# Patient Record
Sex: Female | Born: 1975 | Race: Black or African American | Hispanic: No | Marital: Single | Smoking: Never smoker
Health system: Southern US, Community
[De-identification: ages and names within clinical notes are randomized; demographics above are authoritative.]

## PROBLEM LIST (undated history)

## (undated) ENCOUNTER — Inpatient Hospital Stay (HOSPITAL_COMMUNITY): Payer: Self-pay

## (undated) DIAGNOSIS — N912 Amenorrhea, unspecified: Secondary | ICD-10-CM

## (undated) DIAGNOSIS — N83209 Unspecified ovarian cyst, unspecified side: Secondary | ICD-10-CM

## (undated) DIAGNOSIS — D573 Sickle-cell trait: Secondary | ICD-10-CM

## (undated) DIAGNOSIS — G43909 Migraine, unspecified, not intractable, without status migrainosus: Secondary | ICD-10-CM

## (undated) DIAGNOSIS — B379 Candidiasis, unspecified: Secondary | ICD-10-CM

## (undated) DIAGNOSIS — Z8619 Personal history of other infectious and parasitic diseases: Secondary | ICD-10-CM

## (undated) DIAGNOSIS — Z98891 History of uterine scar from previous surgery: Secondary | ICD-10-CM

## (undated) DIAGNOSIS — J45909 Unspecified asthma, uncomplicated: Secondary | ICD-10-CM

## (undated) HISTORY — DX: Migraine, unspecified, not intractable, without status migrainosus: G43.909

## (undated) HISTORY — DX: Amenorrhea, unspecified: N91.2

## (undated) HISTORY — DX: Sickle-cell trait: D57.3

## (undated) HISTORY — DX: Candidiasis, unspecified: B37.9

## (undated) HISTORY — PX: WISDOM TOOTH EXTRACTION: SHX21

## (undated) HISTORY — DX: Unspecified ovarian cyst, unspecified side: N83.209

## (undated) HISTORY — DX: Personal history of other infectious and parasitic diseases: Z86.19

---

## 2007-11-02 ENCOUNTER — Emergency Department (HOSPITAL_COMMUNITY): Admission: EM | Admit: 2007-11-02 | Discharge: 2007-11-02 | Payer: Self-pay | Admitting: Emergency Medicine

## 2007-11-02 ENCOUNTER — Encounter (INDEPENDENT_AMBULATORY_CARE_PROVIDER_SITE_OTHER): Payer: Self-pay | Admitting: Interventional Cardiology

## 2010-09-15 NOTE — Consult Note (Signed)
NAMEAERIELLE, STOKLOSA NO.:  [id number]   MEDICAL RECORD NO.:  [id number]          PATIENT TYPE:  EMS   LOCATION:  ED                           FACILITY:  [facility]   PHYSICIAN:  Lyn Records, M.D.   DATE OF BIRTH:  09-20-1975   DATE OF CONSULTATION:  DATE OF DISCHARGE:  11/02/2007                                 CONSULTATION   CONCLUSIONS:  1. Abnormal EKG with inferolateral T-wave abnormality of uncertain      significance.  EKG was done when the patient was having pain and      some component of hyperventilation and it was felt that the EKG      changes may simply represent hyperventilatory EKGs, T-wave shifts.      a.     Normal 2-D Doppler echocardiogram.      b.     Negative cardiac markers for evidence of cardiac injury.  2. Generalized myalgias including chest wall soreness and pain.      a.     Elevated temperature.      b.     Chills.   RECOMMENDATIONS:  1. Anti-inflammatory therapy to treat suspected viral syndrome.  2. Provide Dr. Samuel Bouche with a copy of the EKG done today and I will      recommend that she have a repeat EKG once this viral syndrome has      resolved.  3. If recurrent chest pain, we will require further cardiac workup to      exclude ischemic heart disease.  4. No specific other cardiac measures are felt to be indicated at this      time.   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  She denies any prior history of  heart disease, she is not diabetic, she does not smoke, and is not  hypertensive.  This morning, she awakened with soreness and discomfort  in her chest.  This quickly spread to the large muscle groups throughout  the rest of her body including her arms and legs.  She began having some  chills and feels that she developed a low-grade temperature.  She came  to the emergency room, when she was in significant discomfort with  aching throughout her body and also  felt that she was quite nervous and  would have been hyperventilated somewhat.  The chest discomfort that she  initially experienced has significantly resolved.  Because she had chest  discomfort, an EKG was done in the emergency room that revealed heart  rate of 98 beats per minute with inverted T waves, II, III, and aVF, V4  through V6.  She is currently not having any pleuritic component to  chest discomfort and the generalized muscle aches and pains have also  subsided.   MEDICATIONS:  None.   ALLERGIES:  None.   SIGNIFICANT MEDICAL PROBLEMS:  None.   HABITS:  She does not smoke.   PHYSICAL EXAMINATION:  GENERAL:  On exam, the patient is in no acute  distress.  VITAL SIGNS:  Blood pressures 120/72.  Heart rate is around 100.  She  denies dyspnea.  Respirations 16.  SKIN:  Clear.  No nail bed cyanosis is noted.  HEENT:  Pupils are equal and reactive.  No obvious jaundice is noted.  NECK:  No carotid bruits are heard.  CARDIAC:  Exam is unremarkable.  No rub is heard.  No murmur is heard.  LUNGS:  Clear.  EXTREMITIES:  Reveal no edema.  Pulses are equal and symmetric in the  feet and in the upper extremities.   LABORATORY DATA:  EKG demonstrates prominent voltage with inferolateral  biphasic to inverted T-waves.  No acute ST-T wave changes noted.   Laboratory data reveals a hemoglobin of 13.6 and WBC of 11.3.  Potassium  of 3.7 and creatinine of 0.81.  Liver tests are normal.  Point-of-care  cardiac markers are negative x2.  D-dimer is normal at 0.36.  Chest x-  ray reveals no acute process.  Cardiac size is normal.  There is no  evidence of congestion.   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  I  believe she is in the midst of a viral syndrome.  EKG does demonstrate  inferolateral T-wave inversions.  Explanation for this is obscured at  this point.  Possibilities include T-wave inversion secondary to  hyperventilation related to her viral  syndrome and anxiety.  Other  option/possibility includes chronic T-wave abnormality that is simply  picked up at this time, but not related to her current clinical process.  If these are chronic changes, they could reflect the presence of  hypertension.  We did do a stat echo today, which demonstrated no  significant structural abnormalities.  The posterior wall thickness is  at the upper limit of normal.  The EF is 75%.  The valves are all  normal.  There is no pericardial effusion.  I feel confident allowing  her to be discharged home on anti-inflammatory therapy and certainly  should she have any recurrence, if any significant chest discomfort,  further evaluation with an ischemic evaluation should be considered.  This is especially important in light of her mother's history of  myocardial infarction at age 20.      Lyn Records, M.D.  Electronically Signed     HWS/MEDQ  D:  11/02/2007  T:  11/02/2007  Job:  [id number]   cc:   Seleta Rhymes, DO

## 2011-01-28 LAB — URINE CULTURE: Colony Count: 2000

## 2011-01-28 LAB — CBC
HCT: 39.5
MCV: 82.7
Platelets: 300
RDW: 15.6 — ABNORMAL HIGH

## 2011-01-28 LAB — URINALYSIS, ROUTINE W REFLEX MICROSCOPIC
Bilirubin Urine: NEGATIVE
Ketones, ur: NEGATIVE
Nitrite: NEGATIVE
Protein, ur: NEGATIVE
pH: 8.5 — ABNORMAL HIGH

## 2011-01-28 LAB — COMPREHENSIVE METABOLIC PANEL
Albumin: 3.6
BUN: 10
Creatinine, Ser: 0.81
Potassium: 3.7
Total Protein: 6.9

## 2011-01-28 LAB — URINE MICROSCOPIC-ADD ON

## 2011-01-28 LAB — DIFFERENTIAL
Lymphocytes Relative: 8 — ABNORMAL LOW
Monocytes Absolute: 0.6
Monocytes Relative: 6
Neutro Abs: 9.6 — ABNORMAL HIGH

## 2011-01-28 LAB — PROTIME-INR: INR: 0.9

## 2011-01-28 LAB — POCT CARDIAC MARKERS
CKMB, poc: <1 — ABNORMAL LOW
Myoglobin, poc: 41.1

## 2011-01-28 LAB — APTT: aPTT: 25

## 2011-07-15 ENCOUNTER — Ambulatory Visit (INDEPENDENT_AMBULATORY_CARE_PROVIDER_SITE_OTHER): Payer: PRIVATE HEALTH INSURANCE | Admitting: Obstetrics and Gynecology

## 2011-07-15 DIAGNOSIS — N912 Amenorrhea, unspecified: Secondary | ICD-10-CM

## 2011-07-15 DIAGNOSIS — Z202 Contact with and (suspected) exposure to infections with a predominantly sexual mode of transmission: Secondary | ICD-10-CM

## 2011-12-24 DIAGNOSIS — D573 Sickle-cell trait: Secondary | ICD-10-CM | POA: Insufficient documentation

## 2011-12-24 DIAGNOSIS — G43909 Migraine, unspecified, not intractable, without status migrainosus: Secondary | ICD-10-CM | POA: Insufficient documentation

## 2011-12-24 DIAGNOSIS — B379 Candidiasis, unspecified: Secondary | ICD-10-CM

## 2011-12-24 DIAGNOSIS — N912 Amenorrhea, unspecified: Secondary | ICD-10-CM

## 2011-12-24 DIAGNOSIS — N83209 Unspecified ovarian cyst, unspecified side: Secondary | ICD-10-CM

## 2011-12-24 DIAGNOSIS — Z8619 Personal history of other infectious and parasitic diseases: Secondary | ICD-10-CM | POA: Insufficient documentation

## 2011-12-30 ENCOUNTER — Ambulatory Visit (INDEPENDENT_AMBULATORY_CARE_PROVIDER_SITE_OTHER): Payer: PRIVATE HEALTH INSURANCE | Admitting: Obstetrics and Gynecology

## 2011-12-30 ENCOUNTER — Encounter: Payer: Self-pay | Admitting: Obstetrics and Gynecology

## 2011-12-30 VITALS — BP 128/72 | Ht 64.0 in | Wt 200.0 lb

## 2011-12-30 DIAGNOSIS — D573 Sickle-cell trait: Secondary | ICD-10-CM

## 2011-12-30 DIAGNOSIS — Z2089 Contact with and (suspected) exposure to other communicable diseases: Secondary | ICD-10-CM

## 2011-12-30 DIAGNOSIS — Z124 Encounter for screening for malignant neoplasm of cervix: Secondary | ICD-10-CM

## 2011-12-30 DIAGNOSIS — N912 Amenorrhea, unspecified: Secondary | ICD-10-CM

## 2011-12-30 DIAGNOSIS — Z202 Contact with and (suspected) exposure to infections with a predominantly sexual mode of transmission: Secondary | ICD-10-CM

## 2011-12-30 LAB — HCG, QUANTITATIVE, PREGNANCY: hCG, Beta Chain, Quant, S: <2 m[IU]/mL

## 2011-12-30 NOTE — Progress Notes (Signed)
Subjective:  AEX:  Last Pap: 08/04/2010 WNL: Yes.  No history of abnormal Pap Regular Periods:yes Contraception: None Continued in March of 2013 Monthly Breast exam:yes pt c/o feeling lumps in breast since stopping BC in 07/2011 Tetanus<66yrs:yes Nl.Bladder Function:yes Daily BMs:no c/o constipation Healthy Diet:yes Calcium:no Mammogram:no  family history of breast cancer in maternal aunt, great grandmother, and paternal aunt Date of Mammogram: n/a Exercise:yes Have often Exercise: 4-5 times weekly Seatbelt: yes Abuse at home: no Stressful work:yes Sigmoid-colonoscopy: n/a Bone Density: No PCP: None Change in PMH: None Change in [facility]: None    Kathryn Madden is a 36 y.o. female, G0P0000, who presents for an annual exam.     History   Social History  . Marital Status: Single    Spouse Name: N/A    Number of Children: N/A  . Years of Education: N/A   Social History Main Topics  . Smoking status: Never Smoker   . Smokeless tobacco: Never Used  . Alcohol Use: No  . Drug Use: No  . Sexually Active: Yes    Birth Control/ Protection: None   Other Topics Concern  . None   Social History Narrative  . None    Menstrual cycle:   LMP: Patient's last menstrual period was 11/21/2011. had only 4 days of spotting at time for usual cycles           Cycle: Q 28 days since discontinuing BCPs for 5-7 days.  Cramps have recurred needs ibuprofen  The following portions of the patient's history were reviewed and updated as appropriate: allergies, current medications, past family history, past medical history, past social history, past surgical history and problem list.  Review of Systems Pertinent items are noted in HPI. Breast:Negative for breast lump,nipple discharge or nipple retraction Gastrointestinal: Negative for abdominal pain, change in bowel habits or rectal bleeding Urinary:negative   Objective:    BP 128/72  Ht 5\' 4"  (1.626 m)  Wt 200 lb (90.719 kg)  BMI 34.33  kg/m2  LMP 11/21/2011    Weight:  Wt Readings from Last 1 Encounters:  12/30/11 200 lb (90.719 kg)          BMI: Body mass index is 34.33 kg/(m^2).  General Appearance: Alert, appropriate appearance for age. No acute distress HEENT: Grossly normal Neck / Thyroid: Supple, no masses, nodes or enlargement Lungs: clear to auscultation bilaterally Back: No CVA tenderness Breast Exam: No masses or nodes.No dimpling, nipple retraction or discharge. Cardiovascular: Regular rate and rhythm. S1, S2, no murmur Gastrointestinal: Soft, non-tender, no masses or organomegaly Pelvic Exam: External genitalia: normal general appearance Vaginal: normal mucosa without prolapse or lesions Cervix: normal appearance with exocervical contact bleeding Adnexa: non palpable Uterus: doesnt feel enlarged Exam limited by body habitus Rectovaginal: normal rectal, no masses Lymphatic Exam: Non-palpable nodes in neck, clavicular, axillary, or inguinal regions Skin: no rash or abnormalities Neurologic: Normal gait and speech, no tremor  Psychiatric: Alert and oriented, appropriate affect.   Wet Prep:not applicable Urinalysis:not applicable UPT: Home pregnancy tests x4 neg., Not done   Assessment:    Mild menstrual irregularity On 1 occasion after discontinuing birth control   Desires pregnancy   Plan:    pap smear additional lab tests per orders return annually or prn positive home pregnancy test the Serum pregnancy test STD screening: done Contraception:no method Continue prenatal vitamins      Dierdre Forth, MD

## 2011-12-30 NOTE — Addendum Note (Signed)
Addended by: Lerry Liner D on: 12/30/2011 10:22 AM   Modules accepted: Orders

## 2011-12-30 NOTE — Assessment & Plan Note (Signed)
Discussed implications for reproduction with patient and her on a February 14, 2011.  Hemoglobin electrophoresis was recommended for her partner.

## 2011-12-31 LAB — HIV ANTIBODY (ROUTINE TESTING W REFLEX): HIV: NONREACTIVE

## 2012-01-05 LAB — PAP IG, CT-NG NAA, HPV HIGH-RISK: HPV DNA High Risk: NOT DETECTED

## 2012-01-12 ENCOUNTER — Telehealth: Payer: Self-pay | Admitting: Obstetrics and Gynecology

## 2012-01-12 NOTE — Telephone Encounter (Signed)
Tc to pt per telephone call. Told pt pap smear and STD testings=all negative. Pt voices understanding.

## 2012-02-23 ENCOUNTER — Telehealth: Payer: Self-pay | Admitting: Obstetrics and Gynecology

## 2012-02-23 NOTE — Telephone Encounter (Signed)
Spoke with pt rgd msg pt states having some cramping no spotting no bleeding no leaking fluid advised pt increase water intake and can take tylenol for discomfort if bleeding occur or cramping gets worse call office for eval pt voice understanding

## 2012-02-23 NOTE — Telephone Encounter (Signed)
Lm on vm tcb rgd msg 

## 2012-02-28 ENCOUNTER — Telehealth: Payer: Self-pay | Admitting: Obstetrics and Gynecology

## 2012-02-28 ENCOUNTER — Inpatient Hospital Stay (HOSPITAL_COMMUNITY): Payer: PRIVATE HEALTH INSURANCE

## 2012-02-28 ENCOUNTER — Encounter (HOSPITAL_COMMUNITY): Payer: Self-pay

## 2012-02-28 ENCOUNTER — Inpatient Hospital Stay (HOSPITAL_COMMUNITY)
Admission: AD | Admit: 2012-02-28 | Discharge: 2012-02-28 | Disposition: A | Discharge status: Home / Self Care | Payer: PRIVATE HEALTH INSURANCE | Source: Ambulatory Visit | Attending: Obstetrics and Gynecology | Admitting: Obstetrics and Gynecology

## 2012-02-28 DIAGNOSIS — O26851 Spotting complicating pregnancy, first trimester: Secondary | ICD-10-CM

## 2012-02-28 DIAGNOSIS — O26859 Spotting complicating pregnancy, unspecified trimester: Secondary | ICD-10-CM | POA: Insufficient documentation

## 2012-02-28 DIAGNOSIS — N949 Unspecified condition associated with female genital organs and menstrual cycle: Secondary | ICD-10-CM | POA: Insufficient documentation

## 2012-02-28 DIAGNOSIS — O26899 Other specified pregnancy related conditions, unspecified trimester: Secondary | ICD-10-CM

## 2012-02-28 LAB — URINALYSIS, ROUTINE W REFLEX MICROSCOPIC
Glucose, UA: NEGATIVE mg/dL
Leukocytes, UA: NEGATIVE
Protein, ur: NEGATIVE mg/dL
pH: 6 (ref 5.0–8.0)

## 2012-02-28 LAB — POCT PREGNANCY, URINE: Preg Test, Ur: POSITIVE — AB

## 2012-02-28 NOTE — MAU Note (Signed)
Patient states she has had a positive home pregnancy test. Has been having pelvic pressure for about 2 weeks and is getting worse with a feeling of fullness. Had some light spotting today. Has a white discharge. Nausea, no vomiting. Has an appointment with [facility] on 11-11.

## 2012-02-28 NOTE — Telephone Encounter (Signed)
Spoke with pt regarding msg. Pt is about [redacted] weeks pregnant and some spotting with some pressure. Offered pt app tomorrow with EP pt stated she could not come at the time of the appt. Pt stated [facility]. Pt's voice understanding .

## 2012-02-28 NOTE — Progress Notes (Signed)
hilary steelman cnm notified of patient. Order for ccua and ob ultrasound <14 weeks.

## 2012-02-28 NOTE — MAU Provider Note (Signed)
Chief Complaint: Possible Pregnancy and Pelvic Pain Initiated contact at [phone] Asked to see pt for [facility], CNM, who is in a delivery   SUBJECTIVE HPI: Kathryn Madden is a 36 y.o. G1P0000 at [redacted]w[redacted]d by LMP who presents with first episode scant pink spotting this morning which has not recurred. She's had intermittent suprapubic cramping throughout the day. 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   Past Medical History  Diagnosis Date  . Sickle cell trait   . Migraine   . History of chicken pox   . Yeast infection   . Ovarian cyst   . Amenorrhea   . Monilia infection   No hx HTN  OB History    Grav Para Term Preterm Abortions TAB SAB Ect Mult Living   1 0 0 0 0 0 0 0 0 0      # Outc Date GA Lbr Len/2nd Wgt Sex Del Anes PTL Lv   1 CUR              Past Surgical History  Procedure Date  . Wisdom tooth extraction    History   Social History  . Marital Status: Single    Spouse Name: N/A    Number of Children: N/A  . Years of Education: N/A   Occupational History  . Not on file.   Social History Main Topics  . Smoking status: Never Smoker   . Smokeless tobacco: Never Used  . Alcohol Use: No  . Drug Use: No  . Sexually Active: Yes    Birth Control/ Protection: None   Other Topics Concern  . Not on file   Social History Narrative  . No narrative on file   No current facility-administered medications on file prior to encounter.   Current Outpatient Prescriptions on File Prior to Encounter  Medication Sig Dispense Refill  . Prenatal MV-Min-Fe Fum-FA-DHA (PRENATAL 1 PO) Take by mouth.      . sertraline (ZOLOFT) 100 MG tablet Take 100 mg by mouth daily.       Allergies  Allergen Reactions  . Penicillins     ROS: Pertinent items in HPI  OBJECTIVE Blood pressure 132/95, pulse 77, temperature 98 F (36.7 C), temperature source Oral, resp. rate 16, height 5' 4.5" (1.638 m), weight 93.985 kg (207 lb 3.2 oz), last menstrual period 01/17/2012, SpO2 100.00%. GENERAL:  Well-developed, well-nourished female in no acute distress.  HEENT: Normocephalic HEART: normal rate RESP: normal effort ABDOMEN: Soft, non-tender EXTREMITIES: Nontender, no edema NEURO: Alert and oriented SPECULUM EXAM: NEFG, physiologic white discharge, no blood noted, cervix clean BIMANUAL: cervix posterior, long, closed; uterus slightly enlarged, no adnexal tenderness or masses  LAB RESULTS Results for orders placed during the hospital encounter of 02/28/12 (from the past 24 hour(s))  URINALYSIS, ROUTINE W REFLEX MICROSCOPIC     Status: Normal   Collection Time   02/28/12  6:40 PM      Component Value Range   Color, Urine YELLOW  YELLOW   APPearance CLEAR  CLEAR   Specific Gravity, Urine 1.025  1.005 - 1.030   pH 6.0  5.0 - 8.0   Glucose, UA NEGATIVE  NEGATIVE mg/dL   Hgb urine dipstick NEGATIVE  NEGATIVE   Bilirubin Urine NEGATIVE  NEGATIVE   Ketones, ur NEGATIVE  NEGATIVE mg/dL   Protein, ur NEGATIVE  NEGATIVE mg/dL   Urobilinogen, UA 0.2  0.0 - 1.0 mg/dL   Nitrite NEGATIVE  NEGATIVE   Leukocytes, UA NEGATIVE  NEGATIVE  POCT PREGNANCY,  URINE     Status: Abnormal   Collection Time   02/28/12  6:44 PM      Component Value Range   Preg Test, Ur POSITIVE (*) NEGATIVE    IMAGING US Ob Comp Less 14 Wks  02/28/2012  *RADIOLOGY REPORT*  Clinical Data: Pelvic pain and vaginal bleeding.  Positive urine pregnancy test.  Quantitative beta HCG not ordered.  OBSTETRIC <14 WK [place] AND TRANSVAGINAL OB US  Technique:  Both transabdominal and transvaginal ultrasound examinations were performed for complete evaluation of the gestation as well as the maternal uterus, adnexal regions, and pelvic cul-de-sac.  Transvaginal technique was performed to assess early pregnancy.  Comparison:  None.  Intrauterine gestational sac:  Single, normal in shape Yolk sac: Present Embryo: Present Cardiac Activity: Unable to visualize. Heart Rate: Not applicable CRL: 2.4 mm   5 w  6 d        [place] EDC: 10/24/2012   Maternal uterus/adnexae: No subchorionic hemorrhage.  Normal right and left ovaries.  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  There is no free fluid.  IMPRESSION: Intrauterine gestational sac, embryo, and yolk sac present without visible cardiac activity. Crown-rump length of 2.4 mm corresponds with estimated gestational age of [redacted] weeks, 6 days.  Recommend close f/u of quantitative bHCG levels, and followup US as clinically warranted.   Original Report Authenticated By: Elsie Stain, M.D.    US Ob Transvaginal  02/28/2012  *RADIOLOGY REPORT*  Clinical Data: Pelvic pain and vaginal bleeding.  Positive urine pregnancy test.  Quantitative beta HCG not ordered.  OBSTETRIC <14 WK [place] AND TRANSVAGINAL OB US  Technique:  Both transabdominal and transvaginal ultrasound examinations were performed for complete evaluation of the gestation as well as the maternal uterus, adnexal regions, and pelvic cul-de-sac.  Transvaginal technique was performed to assess early pregnancy.  Comparison:  None.  Intrauterine gestational sac:  Single, normal in shape Yolk sac: Present Embryo: Present Cardiac Activity: Unable to visualize. Heart Rate: Not applicable CRL: 2.4 mm   5 w  6 d        [place] EDC: 10/24/2012  Maternal uterus/adnexae: No subchorionic hemorrhage.  Normal right and left ovaries.  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  There is no free fluid.  IMPRESSION: Intrauterine gestational sac, embryo, and yolk sac present without visible cardiac activity. Crown-rump length of 2.4 mm corresponds with estimated gestational age of [redacted] weeks, 6 days.  Recommend close f/u of quantitative bHCG levels, and followup US as clinically warranted.   Original Report Authenticated By: Elsie Stain, M.D.     MAU COURSE Pt and husband concerned that tech indicated HR present. TC to [employer]: She reports cardiac activity barely visible with M mode  ASSESSMENT 1. Pelvic  pain in pregnancy   2. Spotting complicating pregnancy in first trimester   Probable implantation spotting BP elevation  PLAN Explained discrepancy in reported cardiac activity and technical difficulty at this GA. All questions answered. ABO/Rh and quantitative bHCG sent: Sherron Monday will call pt with F/U Discharge home with pelvic precautions. Declines work excuse.     Medication List     As of 02/28/2012  9:20 PM    ASK your doctor about these medications         PRENATAL 1 PO   Take by mouth.      sertraline 100 MG tablet  Commonly known as: ZOLOFT   Take 100 mg by mouth daily.         Danae Orleans, CNM 02/28/2012  9:20 PM

## 2012-03-06 ENCOUNTER — Telehealth: Payer: Self-pay | Admitting: Obstetrics and Gynecology

## 2012-03-06 NOTE — Telephone Encounter (Signed)
Pt called, is 7 wks, states was seen over the wknd d/t pain/spotting, had [place] and qhcg done, was 5 wks at the time, could not see a heart beat @ the time but is normal at that GA and qhcg was 2200, but did not have a rpt done.  Pt having non-severe low abd cramping today, has been pushing fluids, denies any bleeding.  Pt scheduled for eval tomorrow am w/ AVS @ 0915, pt advised to call after hours with any concerns, pt voices agreement.

## 2012-03-07 ENCOUNTER — Ambulatory Visit (INDEPENDENT_AMBULATORY_CARE_PROVIDER_SITE_OTHER): Payer: PRIVATE HEALTH INSURANCE | Admitting: Obstetrics and Gynecology

## 2012-03-07 ENCOUNTER — Ambulatory Visit (INDEPENDENT_AMBULATORY_CARE_PROVIDER_SITE_OTHER): Payer: PRIVATE HEALTH INSURANCE

## 2012-03-07 ENCOUNTER — Encounter: Payer: Self-pay | Admitting: Obstetrics and Gynecology

## 2012-03-07 VITALS — BP 104/62 | Wt 208.0 lb

## 2012-03-07 DIAGNOSIS — O3680X0 Pregnancy with inconclusive fetal viability, not applicable or unspecified: Secondary | ICD-10-CM

## 2012-03-07 DIAGNOSIS — O200 Threatened abortion: Secondary | ICD-10-CM

## 2012-03-07 NOTE — Progress Notes (Signed)
HISTORY OF PRESENT ILLNESS  Kathryn Madden is a 36 y.o. year old female,G1P0000, who presents for a problem visit. On February 28, 2012 the patient was found to have a quantitative hCG of 22,285.  An ultrasound was performed that showed a five-week six-day gestation with no fetal heart motion.  Her estimated due date is October 24, 2012. Her blood type is B+.  Subjective:  The patient is concerned because she no longer feels pregnant.  She is not having any bleeding.  Objective:  BP 104/62  Wt 208 lb (94.348 kg)  LMP 01/17/2012   General: no distress Resp: clear to auscultation bilaterally Cardio: regular rate and rhythm, S1, S2 normal, no murmur, click, rub or gallop GI: nontender  External genitalia: normal general appearance Vaginal: normal without tenderness, induration or masses Cervix: nontender, closed Adnexa: normal bimanual exam Uterus: upper limits normal size  Ultrasound: Single gestation at 7 weeks (due date October 24, 2012), fetal heart rate 153 bpm  Assessment:  [redacted] week gestation (due date October 24, 2012)  Plan:  Return for an OB exam.  Call for questions or concerns.  Return to office in 1 week(s).  Leonard Schwartz M.D.  03/07/2012 9:41 AM    Pt is here today stated that her pregnant symptoms decreased in the past few days. Wanted to make sure everything is ok with the baby.pt  Also stated she has a Hemorid or abscess  around her anus ?

## 2012-03-09 ENCOUNTER — Other Ambulatory Visit: Payer: Self-pay | Admitting: Obstetrics and Gynecology

## 2012-03-09 DIAGNOSIS — O3680X0 Pregnancy with inconclusive fetal viability, not applicable or unspecified: Secondary | ICD-10-CM

## 2012-03-09 LAB — US OB TRANSVAGINAL

## 2012-03-13 ENCOUNTER — Ambulatory Visit (INDEPENDENT_AMBULATORY_CARE_PROVIDER_SITE_OTHER): Payer: PRIVATE HEALTH INSURANCE | Admitting: Obstetrics and Gynecology

## 2012-03-13 ENCOUNTER — Other Ambulatory Visit: Payer: Self-pay | Admitting: Obstetrics and Gynecology

## 2012-03-13 ENCOUNTER — Telehealth: Payer: Self-pay | Admitting: Obstetrics and Gynecology

## 2012-03-13 DIAGNOSIS — Z331 Pregnant state, incidental: Secondary | ICD-10-CM

## 2012-03-13 MED ORDER — ONDANSETRON 8 MG PO TBDP: 8.0000 mg | ORAL_TABLET | Freq: Three times a day (TID) | ORAL | Status: DC | PRN

## 2012-03-13 NOTE — Telephone Encounter (Signed)
Tc to pt to inform Zofran ODT was e-prescribed to pharmacy, pt voices agreement.

## 2012-03-13 NOTE — Telephone Encounter (Signed)
Pt calling rgd her rx 

## 2012-03-14 ENCOUNTER — Encounter: Payer: Self-pay | Admitting: Obstetrics and Gynecology

## 2012-03-14 DIAGNOSIS — Z880 Allergy status to penicillin: Secondary | ICD-10-CM | POA: Insufficient documentation

## 2012-03-14 LAB — CMV IGM: CMV IgM: 0.1 (ref <0.90)

## 2012-03-14 LAB — PRENATAL PANEL VII
Basophils Absolute: 0 10*3/uL (ref 0.0–0.1)
Basophils Relative: 0 % (ref 0–1)
Eosinophils Absolute: 0.1 10*3/uL (ref 0.0–0.7)
HIV: NONREACTIVE
Hemoglobin: 13 g/dL (ref 12.0–15.0)
Hepatitis B Surface Ag: NEGATIVE
MCH: 28.2 pg (ref 26.0–34.0)
MCHC: 34.2 g/dL (ref 30.0–36.0)
Monocytes Relative: 9 % (ref 3–12)
Neutro Abs: 5.6 10*3/uL (ref 1.7–7.7)
Neutrophils Relative %: 63 % (ref 43–77)
RDW: 16.3 % — ABNORMAL HIGH (ref 11.5–15.5)
Rh Type: POSITIVE

## 2012-03-14 LAB — CYTOMEGALOVIRUS ANTIBODY, IGG: Cytomegalovirus Ab-IgG: 0.22 (ref <0.90)

## 2012-03-14 LAB — TOXOPLASMA GONDII ANTIBODY, IGG: Toxoplasma IgG Ratio: <3 IU/mL (ref <6.0)

## 2012-03-14 LAB — TOXOPLASMA GONDII ANTIBODY, IGM: Toxoplasma Antibody- IgM: <3 IU/mL (ref <8.0)

## 2012-03-14 NOTE — Progress Notes (Signed)
NOB interview completed.  Pt is an ER MD, pt requests titers for CMV, Toxo, and Parvo, per SL ok to include in labs today.  NOB work up scheduled for 04/05/12 @ 1400.

## 2012-03-15 LAB — CULTURE, OB URINE: Colony Count: 2000

## 2012-03-17 ENCOUNTER — Telehealth: Payer: Self-pay | Admitting: Obstetrics and Gynecology

## 2012-03-17 NOTE — Telephone Encounter (Signed)
Spoke with pt rgd msg. Pt stated she was calling for her lab results. Per VL advised pt that she has been exposed to parvovirus sometime . Advised pt all other labs are wnl. Pt's voice understanding

## 2012-04-05 ENCOUNTER — Encounter: Payer: Self-pay | Admitting: Obstetrics and Gynecology

## 2012-04-05 ENCOUNTER — Ambulatory Visit (INDEPENDENT_AMBULATORY_CARE_PROVIDER_SITE_OTHER): Payer: PRIVATE HEALTH INSURANCE | Admitting: Obstetrics and Gynecology

## 2012-04-05 VITALS — BP 124/70 | Wt 210.0 lb

## 2012-04-05 DIAGNOSIS — Z331 Pregnant state, incidental: Secondary | ICD-10-CM

## 2012-04-05 DIAGNOSIS — Z36 Encounter for antenatal screening of mother: Secondary | ICD-10-CM

## 2012-04-05 LAB — POCT WET PREP (WET MOUNT)

## 2012-04-05 NOTE — Patient Instructions (Signed)
ABCs of Pregnancy  A  Antepartum care is very important. Be sure you see your doctor and get prenatal care as soon as you think you are pregnant. At this time, you will be tested for infection, genetic abnormalities and potential problems with you and the pregnancy. This is the time to discuss diet, exercise, work, medications, labor, pain medication during labor and the possibility of a cesarean delivery. Ask any questions that may concern you. It is important to see your doctor regularly throughout your pregnancy. Avoid exposure to toxic substances and chemicals - such as cleaning solvents, lead and mercury, some insecticides, and paint. Pregnant women should avoid exposure to paint fumes, and fumes that cause you to feel ill, dizzy or faint. When possible, it is a good idea to have a pre-pregnancy consultation with your caregiver to begin some important recommendations your caregiver suggests such as, taking folic acid, exercising, quitting smoking, avoiding alcoholic beverages, etc.  B  Breastfeeding is the healthiest choice for both you and your baby. It has many nutritional benefits for the baby and health benefits for the mother. It also creates a very tight and loving bond between the baby and mother. Talk to your doctor, your family and friends, and your employer about how you choose to feed your baby and how they can support you in your decision. Not all birth defects can be prevented, but a woman can take actions that may increase her chance of having a healthy baby. Many birth defects happen very early in pregnancy, sometimes before a woman even knows she is pregnant. Birth defects or abnormalities of any child in your or the father's family should be discussed with your caregiver. Get a good support bra as your breast size changes. Wear it especially when you exercise and when nursing.   C  Celebrate the news of your pregnancy with the your spouse/father and family. Childbirth classes are helpful to  take for you and the spouse/father because it helps to understand what happens during the pregnancy, labor and delivery. Cesarean delivery should be discussed with your doctor so you are prepared for that possibility. The pros and cons of circumcision if it is a boy, should be discussed with your pediatrician. Cigarette smoking during pregnancy can result in low birth weight babies. It has been associated with infertility, miscarriages, tubal pregnancies, infant death (mortality) and poor health (morbidity) in childhood. Additionally, cigarette smoking may cause long-term learning disabilities. If you smoke, you should try to quit before getting pregnant and not smoke during the pregnancy. Secondary smoke may also harm a mother and her developing baby. It is a good idea to ask people to stop smoking around you during your pregnancy and after the baby is born. Extra calcium is necessary when you are pregnant and is found in your prenatal vitamin, in dairy products, green leafy vegetables and in calcium supplements.  D  A healthy diet according to your current weight and height, along with vitamins and mineral supplements should be discussed with your caregiver. Domestic abuse or violence should be made known to your doctor right away to get the situation corrected. Drink more water when you exercise to keep hydrated. Discomfort of your back and legs usually develops and progresses from the middle of the second trimester through to delivery of the baby. This is because of the enlarging baby and uterus, which may also affect your balance. Do not take illegal drugs. Illegal drugs can seriously harm the baby and you. Drink extra   fluids (water is best) throughout pregnancy to help your body keep up with the increases in your blood volume. Drink at least 6 to 8 glasses of water, fruit juice, or milk each day. A good way to know you are drinking enough fluid is when your urine looks almost like clear water or is very light  yellow.   E  Eat healthy to get the nutrients you and your unborn baby need. Your meals should include the five basic food groups. Exercise (30 minutes of light to moderate exercise a day) is important and encouraged during pregnancy, if there are no medical problems or problems with the pregnancy. Exercise that causes discomfort or dizziness should be stopped and reported to your caregiver. Emotions during pregnancy can change from being ecstatic to depression and should be understood by you, your partner and your family.  F  Fetal screening with ultrasound, amniocentesis and monitoring during pregnancy and labor is common and sometimes necessary. Take 400 micrograms of folic acid daily both before, when possible, and during the first few months of pregnancy to reduce the risk of birth defects of the brain and spine. All women who could possibly become pregnant should take a vitamin with folic acid, every day. It is also important to eat a healthy diet with fortified foods (enriched grain products, including cereals, rice, breads, and pastas) and foods with natural sources of folate (orange juice, green leafy vegetables, beans, peanuts, broccoli, asparagus, peas, and lentils). The father should be involved with all aspects of the pregnancy including, the prenatal care, childbirth classes, labor, delivery, and postpartum time. Fathers may also have emotional concerns about being a father, financial needs, and raising a family.  G  Genetic testing should be done appropriately. It is important to know your family and the father's history. If there have been problems with pregnancies or birth defects in your family, report these to your doctor. Also, genetic counselors can talk with you about the information you might need in making decisions about having a family. You can call a major medical center in your area for help in finding a board-certified genetic counselor. Genetic testing and counseling should be done  before pregnancy when possible, especially if there is a history of problems in the mother's or father's family. Certain ethnic backgrounds are more at risk for genetic defects.  H  Get familiar with the hospital where you will be having your baby. Get to know how long it takes to get there, the labor and delivery area, and the hospital procedures. Be sure your medical insurance is accepted there. Get your home ready for the baby including, clothes, the baby's room (when possible), furniture and car seat. Hand washing is important throughout the day, especially after handling raw meat and poultry, changing the baby's diaper or using the bathroom. This can help prevent the spread of many bacteria and viruses that cause infection. Your hair may become dry and thinner, but will return to normal a few weeks after the baby is born. Heartburn is a common problem that can be treated by taking antacids recommended by your caregiver, eating smaller meals 5 or 6 times a day, not drinking liquids when eating, drinking between meals and raising the head of your bed 2 to 3 inches.  I  Insurance to cover you, the baby, doctor and hospital should be reviewed so that you will be prepared to pay any costs not covered by your insurance plan. If you do not have medical insurance,   there are usually clinics and services available for you in your community. Take 30 milligrams of iron during your pregnancy as prescribed by your doctor to reduce the risk of low red blood cells (anemia) later in pregnancy. All women of childbearing age should eat a diet rich in iron.  J  There should be a joint effort for the mother, father and any other children to adapt to the pregnancy financially, emotionally, and psychologically during the pregnancy. Join a support group for moms-to-be. Or, join a class on parenting or childbirth. Have the family participate when possible.  K  Know your limits. Let your caregiver know if you experience any of the  following:   · Pain of any kind.  · Strong cramps.  · You develop a lot of weight in a short period of time (5 pounds in 3 to 5 days).  · Vaginal bleeding, leaking of amniotic fluid.  · Headache, vision problems.  · Dizziness, fainting, shortness of breath.  · Chest pain.  · Fever of 102° F (38.9° C) or higher.  · Gush of clear fluid from your vagina.  · Painful urination.  · Domestic violence.  · Irregular heartbeat (palpitations).  · Rapid beating of the heart (tachycardia).  · Constant feeling sick to your stomach (nauseous) and vomiting.  · Trouble walking, fluid retention (edema).  · Muscle weakness.  · If your baby has decreased activity.  · Persistent diarrhea.  · Abnormal vaginal discharge.  · Uterine contractions at 20-minute intervals.  · Back pain that travels down your leg.  L  Learn and practice that what you eat and drink should be in moderation and healthy for you and your baby. Legal drugs such as alcohol and caffeine are important issues for pregnant women. There is no safe amount of alcohol a woman can drink while pregnant. Fetal alcohol syndrome, a disorder characterized by growth retardation, facial abnormalities, and central nervous system dysfunction, is caused by a woman's use of alcohol during pregnancy. Caffeine, found in tea, coffee, soft drinks and chocolate, should also be limited. Be sure to read labels when trying to cut down on caffeine during pregnancy. More than 200 foods, beverages, and over-the-counter medications contain caffeine and have a high salt content! There are coffees and teas that do not contain caffeine.  M  Medical conditions such as diabetes, epilepsy, and high blood pressure should be treated and kept under control before pregnancy when possible, but especially during pregnancy. Ask your caregiver about any medications that may need to be changed or adjusted during pregnancy. If you are currently taking any medications, ask your caregiver if it is safe to take them  while you are pregnant or before getting pregnant when possible. Also, be sure to discuss any herbs or vitamins you are taking. They are medicines, too! Discuss with your doctor all medications, prescribed and over-the-counter, that you are taking. During your prenatal visit, discuss the medications your doctor may give you during labor and delivery.  N  Never be afraid to ask your doctor or caregiver questions about your health, the progress of the pregnancy, family problems, stressful situations, and recommendation for a pediatrician, if you do not have one. It is better to take all precautions and discuss any questions or concerns you may have during your office visits. It is a good idea to write down your questions before you visit the doctor.  O  Over-the-counter cough and cold remedies may contain alcohol or other ingredients that should   be avoided during pregnancy. Ask your caregiver about prescription, herbs or over-the-counter medications that you are taking or may consider taking while pregnant.   P  Physical activity during pregnancy can benefit both you and your baby by lessening discomfort and fatigue, providing a sense of well-being, and increasing the likelihood of early recovery after delivery. Light to moderate exercise during pregnancy strengthens the belly (abdominal) and back muscles. This helps improve posture. Practicing yoga, walking, swimming, and cycling on a stationary bicycle are usually safe exercises for pregnant women. Avoid scuba diving, exercise at high altitudes (over 3000 feet), skiing, horseback riding, contact sports, etc. Always check with your doctor before beginning any kind of exercise, especially during pregnancy and especially if you did not exercise before getting pregnant.  Q  Queasiness, stomach upset and morning sickness are common during pregnancy. Eating a couple of crackers or dry toast before getting out of bed. Foods that you normally love may make you feel sick to  your stomach. You may need to substitute other nutritious foods. Eating 5 or 6 small meals a day instead of 3 large ones may make you feel better. Do not drink with your meals, drink between meals. Questions that you have should be written down and asked during your prenatal visits.  R  Read about and make plans to baby-proof your home. There are important tips for making your home a safer environment for your baby. Review the tips and make your home safer for you and your baby. Read food labels regarding calories, salt and fat content in the food.  S  Saunas, hot tubs, and steam rooms should be avoided while you are pregnant. Excessive high heat may be harmful during your pregnancy. Your caregiver will screen and examine you for sexually transmitted diseases and genetic disorders during your prenatal visits. Learn the signs of labor. Sexual relations while pregnant is safe unless there is a medical or pregnancy problem and your caregiver advises against it.  T  Traveling long distances should be avoided especially in the third trimester of your pregnancy. If you do have to travel out of state, be sure to take a copy of your medical records and medical insurance plan with you. You should not travel long distances without seeing your doctor first. Most airlines will not allow you to travel after 36 weeks of pregnancy. Toxoplasmosis is an infection caused by a parasite that can seriously harm an unborn baby. Avoid eating undercooked meat and handling cat litter. Be sure to wear gloves when gardening. Tingling of the hands and fingers is not unusual and is due to fluid retention. This will go away after the baby is born.  U  Womb (uterus) size increases during the first trimester. Your kidneys will begin to function more efficiently. This may cause you to feel the need to urinate more often. You may also leak urine when sneezing, coughing or laughing. This is due to the growing uterus pressing against your bladder,  which lies directly in front of and slightly under the uterus during the first few months of pregnancy. If you experience burning along with frequency of urination or bloody urine, be sure to tell your doctor. The size of your uterus in the third trimester may cause a problem with your balance. It is advisable to maintain good posture and avoid wearing high heels during this time. An ultrasound of your baby may be necessary during your pregnancy and is safe for you and your baby.  V    Vaccinations are an important concern for pregnant women. Get needed vaccines before pregnancy. Center for Disease Control (www.cdc.gov) has clear guidelines for the use of vaccines during pregnancy. Review the list, be sure to discuss it with your doctor. Prenatal vitamins are helpful and healthy for you and the baby. Do not take extra vitamins except what is recommended. Taking too much of certain vitamins can cause overdose problems. Continuous vomiting should be reported to your caregiver. Varicose veins may appear especially if there is a family history of varicose veins. They should subside after the delivery of the baby. Support hose helps if there is leg discomfort.  W  Being overweight or underweight during pregnancy may cause problems. Try to get within 15 pounds of your ideal weight before pregnancy. Remember, pregnancy is not a time to be dieting! Do not stop eating or start skipping meals as your weight increases. Both you and your baby need the calories and nutrition you receive from a healthy diet. Be sure to consult with your doctor about your diet. There is a formula and diet plan available depending on whether you are overweight or underweight. Your caregiver or nutritionist can help and advise you if necessary.  X  Avoid X-rays. If you must have dental work or diagnostic tests, tell your dentist or physician that you are pregnant so that extra care can be taken. X-rays should only be taken when the risks of not taking  them outweigh the risk of taking them. If needed, only the minimum amount of radiation should be used. When X-rays are necessary, protective lead shields should be used to cover areas of the body that are not being X-rayed.  Y  Your baby loves you. Breastfeeding your baby creates a loving and very close bond between the two of you. Give your baby a healthy environment to live in while you are pregnant. Infants and children require constant care and guidance. Their health and safety should be carefully watched at all times. After the baby is born, rest or take a nap when the baby is sleeping.  Z  Get your ZZZs. Be sure to get plenty of rest. Resting on your side as often as possible, especially on your left side is advised. It provides the best circulation to your baby and helps reduce swelling. Try taking a nap for 30 to 45 minutes in the afternoon when possible. After the baby is born rest or take a nap when the baby is sleeping. Try elevating your feet for that amount of time when possible. It helps the circulation in your legs and helps reduce swelling.   Most information courtesy of the CDC.  Document Released: 04/19/2005 Document Revised: 07/12/2011 Document Reviewed: 01/01/2009  [employer]® Patient Information ©2013 [employer].

## 2012-04-05 NOTE — Progress Notes (Signed)
[redacted]w[redacted]d PT HAD AEX ON 3/13 WITH GC/CT.  NO PROBLEMS TODAY. PT HAD FLU VACCINE AT HER JOB AND PT HAVE QUESTIONS ABOUT THE COST OF GENETIC TESTING.

## 2012-04-06 LAB — GC/CHLAMYDIA PROBE AMP: GC Probe RNA: NEGATIVE

## 2012-04-13 ENCOUNTER — Ambulatory Visit (INDEPENDENT_AMBULATORY_CARE_PROVIDER_SITE_OTHER): Payer: PRIVATE HEALTH INSURANCE

## 2012-04-13 ENCOUNTER — Encounter: Payer: Self-pay | Admitting: Obstetrics and Gynecology

## 2012-04-13 DIAGNOSIS — Z36 Encounter for antenatal screening of mother: Secondary | ICD-10-CM

## 2012-04-13 LAB — US OB COMP LESS 14 WKS

## 2012-04-13 NOTE — Progress Notes (Signed)
Pt presented for 1st trimester screen. States is having increased reflux.  Had occasionally prior to pregnancy. Eating small frequent meals, avoiding fried or spicey foods. Has tried Pepcid 40 mg bid and Zantac 150 mg bid. Per [facility] may try Prilosec 20 mg qd and increase to 40 mg if needed. Suggested apples to reduce acid and to lie on lt side to optimize emptying of stomach.  To call if no improvement.  Pt verbalizes comprehension.

## 2012-04-18 ENCOUNTER — Encounter (HOSPITAL_COMMUNITY): Payer: Self-pay | Admitting: *Deleted

## 2012-04-18 ENCOUNTER — Inpatient Hospital Stay (HOSPITAL_COMMUNITY): Payer: PRIVATE HEALTH INSURANCE

## 2012-04-18 ENCOUNTER — Telehealth (HOSPITAL_COMMUNITY): Payer: Self-pay | Admitting: Obstetrics and Gynecology

## 2012-04-18 ENCOUNTER — Inpatient Hospital Stay (HOSPITAL_COMMUNITY)
Admission: AD | Admit: 2012-04-18 | Discharge: 2012-04-18 | Disposition: A | Discharge status: Home / Self Care | Payer: PRIVATE HEALTH INSURANCE | Source: Ambulatory Visit | Attending: Obstetrics and Gynecology | Admitting: Obstetrics and Gynecology

## 2012-04-18 DIAGNOSIS — Z331 Pregnant state, incidental: Secondary | ICD-10-CM

## 2012-04-18 DIAGNOSIS — O99891 Other specified diseases and conditions complicating pregnancy: Secondary | ICD-10-CM | POA: Insufficient documentation

## 2012-04-18 DIAGNOSIS — Z880 Allergy status to penicillin: Secondary | ICD-10-CM | POA: Insufficient documentation

## 2012-04-18 DIAGNOSIS — N898 Other specified noninflammatory disorders of vagina: Secondary | ICD-10-CM

## 2012-04-18 DIAGNOSIS — L293 Anogenital pruritus, unspecified: Secondary | ICD-10-CM | POA: Insufficient documentation

## 2012-04-18 DIAGNOSIS — R300 Dysuria: Secondary | ICD-10-CM | POA: Insufficient documentation

## 2012-04-18 LAB — WET PREP, GENITAL
Clue Cells Wet Prep HPF POC: NONE SEEN
Trich, Wet Prep: NONE SEEN
Yeast Wet Prep HPF POC: NONE SEEN

## 2012-04-18 LAB — OB RESULTS CONSOLE GC/CHLAMYDIA
Chlamydia: NEGATIVE
Gonorrhea: NEGATIVE

## 2012-04-18 LAB — URINALYSIS, ROUTINE W REFLEX MICROSCOPIC
Bilirubin Urine: NEGATIVE
Hgb urine dipstick: NEGATIVE
Ketones, ur: 15 mg/dL — AB
Protein, ur: NEGATIVE mg/dL
Urobilinogen, UA: 0.2 mg/dL (ref 0.0–1.0)

## 2012-04-18 LAB — URINE MICROSCOPIC-ADD ON

## 2012-04-18 NOTE — Telephone Encounter (Signed)
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  Had 2 episodes of moderate amount thin clear d/c since last night.  No pain or bleeding.  Had [place] last week for 1st trimester screen.  No issues with pregnancy. Does have some vaginal itching. Reviewed status--patient unable to get to office today due to work schedule. Offered evaluation in MAU after patient's shift to ensure all is well. Patient does request that--she will call me when she finishes her shift at 5pm--I will see in MAU. To call prior to that if any bleeding, fever, or pain occurs.

## 2012-04-18 NOTE — MAU Note (Signed)
Felt gush while using bathroom last night @ 2200, was clear & watery.  Intermittent dysuria & vaginal itching.  Has had more gushes of fluid since last night.  Denies bleeding.

## 2012-04-18 NOTE — Progress Notes (Signed)
ULTRAOUND OB LIMITED - NRPT [facility]  Comparison: 02/28/2012  Findings: Single living intrauterine fetus noted with cardiac  activity of 152 beats per minute. Posterior placenta noted without  previa. Amniotic fluid volume currently appears within normal  limits, with a 3.4 cm pocket identified eccentric to the right.  Fetal crown-rump length is 67.1 mm compatible with 12 weeks 6 days  gestation. This is congruent with the last menstrual period.  Maternal cervix measures 3.1 cm by my measurement. No significant  funneling identified. No fluid was seen in the cervical canal.  No maternal free pelvic fluid is observed. No adnexal mass  identified.  IMPRESSION:  1. Single living intrauterine pregnancy, with amniotic fluid  volume currently within normal limits, and with maternal cervix  measuring at 3.1 cm in length without following or fluid in the  endocervical canal. No previa observed. Fetal growth congruent  with the last menstrual period.  UA pending Dr. Normand Sloop calls requests UA, [place] reviewed plan f/o as scheduled, report if gushing will repeat US, discussed common discomforts of pg, bleeding, pain, fever to report. Lavera Guise, CNM

## 2012-04-18 NOTE — MAU Provider Note (Signed)
History   36 yo G1P0 at 13 weeks presents c/o increased wetness last 24 hours, mild itching.  Denies pain or bleeding.  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  Concerned if this is SROM.  Patient Active Problem List  Diagnosis  . Sickle cell trait  . Migraine  . History of chicken pox  . Ovarian cyst  . Amenorrhea  . Allergy to penicillin     Chief Complaint  Patient presents with  . Rupture of Membranes     OB History    Grav Para Term Preterm Abortions TAB SAB Ect Mult Living   1 0 0 0 0 0 0 0 0 0       Past Medical History  Diagnosis Date  . Sickle cell trait   . History of chicken pox   . Yeast infection   . Ovarian cyst   . Amenorrhea   . Monilia infection   . Migraine     [place] in the past'Excedrin or Ibuprofen    Past Surgical History  Procedure Date  . Wisdom tooth extraction     Family History  Problem Relation Age of Onset  . Hypertension Mother   . Heart disease Mother   . Hypertension Father   . Hypertension Brother   . Hyperlipidemia Mother   . Hyperlipidemia Father   . Angina Mother   . Diabetes type II Father   . Breast cancer Maternal Aunt   . Arthritis Maternal Grandmother   . Arthritis Mother   . Migraines Mother   . Hypothyroidism Mother     History  Substance Use Topics  . Smoking status: Never Smoker   . Smokeless tobacco: Never Used  . Alcohol Use: No    Allergies:  Allergies  Allergen Reactions  . Penicillins Rash    Prescriptions prior to admission  Medication Sig Dispense Refill  . calcium carbonate 200 MG capsule Take 400 mg by mouth daily.      . ondansetron (ZOFRAN-ODT) 8 MG disintegrating tablet Take 1 tablet (8 mg total) by mouth every 8 (eight) hours as needed for nausea.  30 tablet  1  . Prenatal MV-Min-Fe Fum-FA-DHA (PRENATAL 1 PO) Take by mouth.      . sertraline (ZOLOFT) 100 MG tablet Take 100 mg by mouth daily.         Physical Exam   Blood pressure 127/81, pulse 106, temperature 98.1 F (36.7 C),  temperature source Oral, resp. rate 18, last menstrual period 01/17/2012.  Chest clear Heart RRR without murmur Abd gravid, FH 13-14 weeks, FHR 155 Pelvic--small amount thin D/C in vault.  Cervix closed, long Ext WNL  GC, chlamydia, wet prep sent.  ED Course  IUP at 13 weeks Vaginal d/c  Consulted with Dr. Normand Sloop regarding question of lower limit of amnisure accuracy--no clear information in Up To Date. Reviewed that with patient--will defer amnisure due to early gestation. Will get [place] for general evaluation of pregnancy. Lavera Guise, CNM, will f/u on results and patient status.   Nigel Bridgeman CNM, [place] 04/18/2012 7:24 PM

## 2012-04-19 LAB — GC/CHLAMYDIA PROBE AMP: CT Probe RNA: NEGATIVE

## 2012-05-04 ENCOUNTER — Ambulatory Visit (INDEPENDENT_AMBULATORY_CARE_PROVIDER_SITE_OTHER): Payer: PRIVATE HEALTH INSURANCE | Admitting: Obstetrics and Gynecology

## 2012-05-04 ENCOUNTER — Encounter: Payer: Self-pay | Admitting: Obstetrics and Gynecology

## 2012-05-04 VITALS — BP 104/60 | Wt 208.0 lb

## 2012-05-04 DIAGNOSIS — Z331 Pregnant state, incidental: Secondary | ICD-10-CM

## 2012-05-04 DIAGNOSIS — Z3689 Encounter for other specified antenatal screening: Secondary | ICD-10-CM

## 2012-05-04 DIAGNOSIS — D573 Sickle-cell trait: Secondary | ICD-10-CM

## 2012-05-04 LAB — OB RESULTS CONSOLE GBS: GBS: NEGATIVE

## 2012-05-04 LAB — POCT URINALYSIS DIPSTICK
Glucose, UA: NEGATIVE
Nitrite, UA: NEGATIVE
Protein, UA: NEGATIVE
Urobilinogen, UA: NEGATIVE

## 2012-05-04 NOTE — Progress Notes (Signed)
Pt has no complaints

## 2012-05-04 NOTE — Progress Notes (Signed)
[redacted]w[redacted]d NT normal. Declines other genetic testing. Urine culture because of a history of sickle cell trait. Anatomy ultrasound next visit. Return to office in 4 weeks. Dr. Stefano Gaul

## 2012-05-04 NOTE — Addendum Note (Signed)
Addended by: Tim Lair on: 05/04/2012 12:53 PM   Modules accepted: Orders

## 2012-05-06 LAB — CULTURE, OB URINE: Colony Count: >=100000

## 2012-05-16 NOTE — Progress Notes (Signed)
Patient ID: Kathryn Madden, female   DOB: Sep 19, 1975, 37 y.o.   MRN: [id number] Subjective:    Kathryn Madden is being seen today for her first obstetrical visit at [redacted]w[redacted]d gestation by LMP and c/w [place] from 11-5  She is 37 y.o. , relationship w FOB married, "Jess Barters"  works full-time at [employer]. Pt is a physician   She denies any vag bleeding, cramping, or discharge.  She denies nausea/vomiting.   Her obstetrical history is significant for: Patient Active Problem List  Diagnosis  . Sickle cell trait  . Migraine  . History of chicken pox  . Ovarian cyst  . Amenorrhea  . Allergy to penicillin      Pregnancy history fully reviewed.  The following portions of the patient's history were reviewed and updated as appropriate: allergies, current medications, past family history, past medical history, past social history, past surgical history and problem list.  Review of Systems Pertinent ROS is described in HPI   Objective:   BP 124/70  Wt 210 lb (95.255 kg)  LMP 01/17/2012 Wt Readings from Last 1 Encounters:  05/04/12 208 lb (94.348 kg)   BMI: There is no height on file to calculate BMI.  General: alert, cooperative and no distress HEENT: grossly normal  Thyroid: normal  Respiratory: clear to auscultation bilaterally Cardiovascular: regular rate and rhythm,  Breasts:  No dominant masses, nipples erect Gastrointestinal: soft, non-tender; no masses,  no organomegaly Extremities: extremities normal, no pain or edema   EXTERNAL GENITALIA: normal appearing vulva with no masses, tenderness or lesions VAGINA: no abnormal discharge, bleeding or lesions CERVIX: no lesions or cervical motion tenderness; cervix closed, long, firm UTERUS: gravid and consistent with 11 weeks ADNEXA: no masses palpable and nontender OB EXAM PELVIMETRY: appears adequate      Assessment:    Pregnancy at  11wks   Plan:     Prenatal labs rv'd  Urine cx neg  Blood type B pos  Pap smear  collected:  Not applicable - August WNL  GC/Chlamydia collected:  yes Wet prep:  neg x3  Discussion of Genetic testing options: Pt desires 1st trim screen and AFP Rv'd +SCT, pt states FOB is + also, pt understands risk of sickle cell disease in infant is 1:4 and declines any further counseling at present  Problem list reviewed and updated. rv'd how and when to call for emergencies rv'd practice routines Discussed nutrition and exercise and common pregnancy discomforts   F/u 1-2wks for 1st trim screen, 4wks ROB    Sanda Klein, CNM

## 2012-06-02 ENCOUNTER — Ambulatory Visit: Payer: PRIVATE HEALTH INSURANCE

## 2012-06-02 ENCOUNTER — Other Ambulatory Visit: Payer: PRIVATE HEALTH INSURANCE

## 2012-06-02 ENCOUNTER — Encounter: Payer: Self-pay | Admitting: Obstetrics and Gynecology

## 2012-06-02 ENCOUNTER — Encounter: Payer: PRIVATE HEALTH INSURANCE | Admitting: Obstetrics and Gynecology

## 2012-06-02 ENCOUNTER — Ambulatory Visit: Payer: PRIVATE HEALTH INSURANCE | Admitting: Obstetrics and Gynecology

## 2012-06-02 VITALS — BP 100/60 | Wt 208.0 lb

## 2012-06-02 DIAGNOSIS — Z3490 Encounter for supervision of normal pregnancy, unspecified, unspecified trimester: Secondary | ICD-10-CM

## 2012-06-02 DIAGNOSIS — Z1389 Encounter for screening for other disorder: Secondary | ICD-10-CM

## 2012-06-02 DIAGNOSIS — Z3689 Encounter for other specified antenatal screening: Secondary | ICD-10-CM

## 2012-06-02 NOTE — Progress Notes (Signed)
[redacted]w[redacted]d U/S - c/w dates, cvx 3.4cm, vtx, no fetal abnormality, post placenta, on ant subserosal fibroid 2.6cm No complaints Questions ansswered RTO 4wks

## 2012-06-08 LAB — US OB COMP + 14 WK

## 2012-07-03 ENCOUNTER — Encounter: Payer: Self-pay | Admitting: Obstetrics and Gynecology

## 2012-07-03 ENCOUNTER — Ambulatory Visit: Payer: PRIVATE HEALTH INSURANCE | Admitting: Obstetrics and Gynecology

## 2012-07-03 VITALS — BP 108/76 | Wt 210.0 lb

## 2012-07-03 DIAGNOSIS — Z331 Pregnant state, incidental: Secondary | ICD-10-CM

## 2012-07-03 LAB — POCT URINALYSIS DIPSTICK
Bilirubin, UA: NEGATIVE
Blood, UA: NEGATIVE
Glucose, UA: NEGATIVE
Nitrite, UA: NEGATIVE
Spec Grav, UA: 1.015
Urobilinogen, UA: NEGATIVE

## 2012-07-03 NOTE — Progress Notes (Signed)
[redacted]w[redacted]d Pt with reflux. She is using zantac glucola @[place]  She is considering water birth.

## 2012-07-03 NOTE — Patient Instructions (Signed)
Heartburn During Pregnancy  Heartburn is a burning sensation in the chest caused by stomach acid backing up into the esophagus. Heartburn (also known as "reflux") is common in pregnancy because a certain hormone (progesterone) changes. The progesterone hormone may relax the valve that separates the esophagus from the stomach. This allows acid to go up into the esophagus, causing heartburn. Heartburn may also happen in pregnancy because the enlarging uterus pushes up on the stomach, which pushes more acid into the esophagus. This is especially true in the later stages of pregnancy. Heartburn problems usually go away after giving birth. CAUSES   The progesterone hormone.  Changing hormone levels.  The growing uterus that pushes stomach acid upward.  Large meals.  Certain foods and drinks.  Exercise.  Increased acid production. SYMPTOMS   Burning pain in the chest or lower throat.  Bitter taste in the mouth.  Coughing. DIAGNOSIS  Heartburn is typically diagnosed by your caregiver when taking a careful history of your concern. Your caregiver may order a blood test to check for a certain type of bacteria that is associated with heartburn. Sometimes, heartburn is diagnosed by prescribing a heartburn medicine to see if the symptoms improve. It is rare in pregnancy to have a procedure called an endoscopy. This is when a tube with a light and a camera on the end is used to examine the esophagus and the stomach. TREATMENT   Your caregiver may tell you to use certain over-the-counter medicines (antacids, acid reducers) for mild heartburn.  Your caregiver may prescribe medicines to decrease stomach acid or to protect your stomach lining.  Your caregiver may recommend certain diet changes.  For severe cases, your caregiver may recommend that the head of the bed be elevated on blocks. (Sleeping with more pillows is not an effective treatment as it only changes the position of your head and does  not improve the main problem of stomach acid refluxing into the esophagus.) HOME CARE INSTRUCTIONS   Take all medicines as directed by your caregiver.  Raise the head of your bed by putting blocks under the legs if instructed to by your caregiver.  Do not exercise right after eating.  Avoid eating 2 or 3 hours before bed. Do not lie down right after eating.  Eat small meals throughout the day instead of 3 large meals.  Identify foods and beverages that make your symptoms worse and avoid them. Foods you may want to avoid include:  Peppers.  Chocolate.  High-fat foods, including fried foods.  Spicy foods.  Garlic and onions.  Citrus fruits, including oranges, grapefruit, lemons, and limes.  Food containing tomatoes or tomato products.  Mint.  Carbonated and caffeinated drinks.  Vinegar. SEEK IMMEDIATE MEDICAL CARE IF:   You have severe chest pain that goes down your arm or into your jaw or neck.  You feel sweaty, dizzy, or lightheaded.  You become short of breath.  You vomit blood.  You have difficulty or pain with swallowing.  You have bloody or black, tarry stools.  You have episodes of heartburn more than 3 times a week, for more than 2 weeks. MAKE SURE YOU:  Understand these instructions.  Will watch your condition.  Will get help right away if you are not doing well or get worse. Document Released: 04/16/2000 Document Revised: 07/12/2011 Document Reviewed: 10/08/2010 Exit[employer] Patient Information 2013 [employer].  

## 2012-07-03 NOTE — Progress Notes (Signed)
Dysuria last week

## 2012-10-25 ENCOUNTER — Inpatient Hospital Stay (HOSPITAL_COMMUNITY)
Admission: AD | Admit: 2012-10-25 | Discharge: 2012-10-27 | DRG: 766 | Disposition: A | Discharge status: Home / Self Care | Payer: PRIVATE HEALTH INSURANCE | Source: Ambulatory Visit | Attending: Obstetrics and Gynecology | Admitting: Obstetrics and Gynecology

## 2012-10-25 ENCOUNTER — Encounter (HOSPITAL_COMMUNITY): Payer: Self-pay

## 2012-10-25 ENCOUNTER — Encounter (HOSPITAL_COMMUNITY): Payer: Self-pay | Admitting: Anesthesiology

## 2012-10-25 ENCOUNTER — Inpatient Hospital Stay (HOSPITAL_COMMUNITY): Payer: PRIVATE HEALTH INSURANCE | Admitting: Anesthesiology

## 2012-10-25 ENCOUNTER — Encounter (HOSPITAL_COMMUNITY)
Admission: AD | Disposition: A | Discharge status: Home / Self Care | Payer: Self-pay | Source: Ambulatory Visit | Attending: Obstetrics and Gynecology

## 2012-10-25 DIAGNOSIS — Z98891 History of uterine scar from previous surgery: Secondary | ICD-10-CM

## 2012-10-25 DIAGNOSIS — Z880 Allergy status to penicillin: Secondary | ICD-10-CM

## 2012-10-25 DIAGNOSIS — D573 Sickle-cell trait: Secondary | ICD-10-CM | POA: Diagnosis present

## 2012-10-25 DIAGNOSIS — O9902 Anemia complicating childbirth: Secondary | ICD-10-CM | POA: Diagnosis present

## 2012-10-25 DIAGNOSIS — IMO0001 Reserved for inherently not codable concepts without codable children: Secondary | ICD-10-CM

## 2012-10-25 HISTORY — DX: History of uterine scar from previous surgery: Z98.891

## 2012-10-25 HISTORY — DX: Unspecified asthma, uncomplicated: J45.909

## 2012-10-25 LAB — CBC
HCT: 31.5 % — ABNORMAL LOW (ref 36.0–46.0)
Hemoglobin: 11.2 g/dL — ABNORMAL LOW (ref 12.0–15.0)
RBC: 4.21 MIL/uL (ref 3.87–5.11)
RDW: 16.7 % — ABNORMAL HIGH (ref 11.5–15.5)
WBC: 11.3 10*3/uL — ABNORMAL HIGH (ref 4.0–10.5)

## 2012-10-25 LAB — TYPE AND SCREEN

## 2012-10-25 SURGERY — Surgical Case
Anesthesia: Epidural | Site: Abdomen | Wound class: Clean Contaminated

## 2012-10-25 MED ORDER — SCOPOLAMINE 1 MG/3DAYS TD PT72
1.0000 | MEDICATED_PATCH | Freq: Once | TRANSDERMAL | Status: DC
  Administered 2012-10-25: 1.5 mg via TRANSDERMAL

## 2012-10-25 MED ORDER — OXYCODONE-ACETAMINOPHEN 5-325 MG PO TABS: 1.0000 | ORAL_TABLET | ORAL | Status: DC | PRN

## 2012-10-25 MED ORDER — NALBUPHINE SYRINGE 5 MG/0.5 ML
5.0000 mg | INJECTION | INTRAMUSCULAR | Status: DC | PRN
  Administered 2012-10-26: 5 mg via SUBCUTANEOUS
  Administered 2012-10-26 (×4): 10 mg via SUBCUTANEOUS
  Filled 2012-10-25 (×4): qty 1

## 2012-10-25 MED ORDER — PHENYLEPHRINE 40 MCG/ML (10ML) SYRINGE FOR IV PUSH (FOR BLOOD PRESSURE SUPPORT)
PREFILLED_SYRINGE | INTRAVENOUS | Status: AC
  Filled 2012-10-25: qty 5

## 2012-10-25 MED ORDER — LACTATED RINGERS IV SOLN: INTRAVENOUS | Status: DC

## 2012-10-25 MED ORDER — SODIUM BICARBONATE 8.4 % IV SOLN
INTRAVENOUS | Status: AC
  Filled 2012-10-25: qty 50

## 2012-10-25 MED ORDER — MIDAZOLAM HCL 2 MG/2ML IJ SOLN
0.5000 mg | Freq: Once | INTRAMUSCULAR | Status: AC | PRN
Start: 2012-10-25 — End: 2012-10-25

## 2012-10-25 MED ORDER — NALOXONE HCL 0.4 MG/ML IJ SOLN: 0.4000 mg | INTRAMUSCULAR | Status: DC | PRN

## 2012-10-25 MED ORDER — LIDOCAINE HCL (PF) 1 % IJ SOLN
30.0000 mL | INTRAMUSCULAR | Status: DC | PRN
  Filled 2012-10-25: qty 30

## 2012-10-25 MED ORDER — ONDANSETRON 4 MG PO TBDP
4.0000 mg | ORAL_TABLET | ORAL | Status: AC
  Administered 2012-10-25: 4 mg via ORAL
  Filled 2012-10-25: qty 1

## 2012-10-25 MED ORDER — OXYTOCIN BOLUS FROM INFUSION: 500.0000 mL | INTRAVENOUS | Status: DC

## 2012-10-25 MED ORDER — FENTANYL CITRATE 0.05 MG/ML IJ SOLN: 25.0000 ug | INTRAMUSCULAR | Status: DC | PRN

## 2012-10-25 MED ORDER — OXYTOCIN 10 UNIT/ML IJ SOLN
INTRAMUSCULAR | Status: AC
  Filled 2012-10-25: qty 4

## 2012-10-25 MED ORDER — NALBUPHINE SYRINGE 5 MG/0.5 ML
5.0000 mg | INJECTION | INTRAMUSCULAR | Status: DC | PRN
  Filled 2012-10-25 (×3): qty 1

## 2012-10-25 MED ORDER — SODIUM BICARBONATE 8.4 % IV SOLN
INTRAVENOUS | Status: DC | PRN
  Administered 2012-10-25: 5 mL via EPIDURAL

## 2012-10-25 MED ORDER — METOCLOPRAMIDE HCL 5 MG/ML IJ SOLN: 10.0000 mg | Freq: Three times a day (TID) | INTRAMUSCULAR | Status: DC | PRN

## 2012-10-25 MED ORDER — SODIUM CHLORIDE 0.9 % IJ SOLN: 3.0000 mL | INTRAMUSCULAR | Status: DC | PRN

## 2012-10-25 MED ORDER — ONDANSETRON HCL 4 MG/2ML IJ SOLN
INTRAMUSCULAR | Status: DC | PRN
  Administered 2012-10-25: 4 mg via INTRAVENOUS

## 2012-10-25 MED ORDER — DIPHENHYDRAMINE HCL 50 MG/ML IJ SOLN: 25.0000 mg | INTRAMUSCULAR | Status: DC | PRN

## 2012-10-25 MED ORDER — KETOROLAC TROMETHAMINE 30 MG/ML IJ SOLN
INTRAMUSCULAR | Status: AC
  Filled 2012-10-25: qty 1

## 2012-10-25 MED ORDER — MEPERIDINE HCL 25 MG/ML IJ SOLN: 6.2500 mg | INTRAMUSCULAR | Status: DC | PRN

## 2012-10-25 MED ORDER — MORPHINE SULFATE (PF) 0.5 MG/ML IJ SOLN
INTRAMUSCULAR | Status: DC | PRN
  Administered 2012-10-25: 4 mg via EPIDURAL

## 2012-10-25 MED ORDER — EPHEDRINE 5 MG/ML INJ: 10.0000 mg | INTRAVENOUS | Status: DC | PRN

## 2012-10-25 MED ORDER — MEPERIDINE HCL 25 MG/ML IJ SOLN
INTRAMUSCULAR | Status: DC | PRN
  Administered 2012-10-25: 25 mg via INTRAVENOUS

## 2012-10-25 MED ORDER — SODIUM CHLORIDE 0.9 % IJ SOLN: 3.0000 mL | Freq: Two times a day (BID) | INTRAMUSCULAR | Status: DC

## 2012-10-25 MED ORDER — ACETAMINOPHEN 10 MG/ML IV SOLN
1000.0000 mg | Freq: Four times a day (QID) | INTRAVENOUS | Status: AC | PRN
  Filled 2012-10-25: qty 100

## 2012-10-25 MED ORDER — DIPHENHYDRAMINE HCL 50 MG/ML IJ SOLN
12.5000 mg | INTRAMUSCULAR | Status: DC | PRN
  Administered 2012-10-26: 12.5 mg via INTRAVENOUS
  Filled 2012-10-25: qty 1

## 2012-10-25 MED ORDER — MORPHINE SULFATE (PF) 0.5 MG/ML IJ SOLN
INTRAMUSCULAR | Status: DC | PRN
  Administered 2012-10-25: 1 mg via INTRAVENOUS

## 2012-10-25 MED ORDER — CITRIC ACID-SODIUM CITRATE 334-500 MG/5ML PO SOLN
30.0000 mL | ORAL | Status: DC | PRN
  Filled 2012-10-25: qty 15

## 2012-10-25 MED ORDER — FENTANYL 2.5 MCG/ML BUPIVACAINE 1/10 % EPIDURAL INFUSION (WH - ANES)
INTRAMUSCULAR | Status: AC
  Filled 2012-10-25: qty 125

## 2012-10-25 MED ORDER — ACETAMINOPHEN 325 MG PO TABS: 650.0000 mg | ORAL_TABLET | ORAL | Status: DC | PRN

## 2012-10-25 MED ORDER — KETOROLAC TROMETHAMINE 30 MG/ML IJ SOLN
30.0000 mg | Freq: Four times a day (QID) | INTRAMUSCULAR | Status: AC | PRN
  Administered 2012-10-25: 30 mg via INTRAVENOUS

## 2012-10-25 MED ORDER — ONDANSETRON HCL 4 MG/2ML IJ SOLN: 4.0000 mg | Freq: Four times a day (QID) | INTRAMUSCULAR | Status: DC | PRN

## 2012-10-25 MED ORDER — GENTAMICIN SULFATE 40 MG/ML IJ SOLN
Freq: Once | INTRAMUSCULAR | Status: AC
  Administered 2012-10-25: 100 mL via INTRAVENOUS
  Filled 2012-10-25: qty 9

## 2012-10-25 MED ORDER — FENTANYL 2.5 MCG/ML BUPIVACAINE 1/10 % EPIDURAL INFUSION (WH - ANES): 14.0000 mL/h | INTRAMUSCULAR | Status: DC | PRN

## 2012-10-25 MED ORDER — LACTATED RINGERS IV SOLN
INTRAVENOUS | Status: DC | PRN
  Administered 2012-10-25 (×2): via INTRAVENOUS

## 2012-10-25 MED ORDER — PROMETHAZINE HCL 25 MG/ML IJ SOLN: 6.2500 mg | INTRAMUSCULAR | Status: DC | PRN

## 2012-10-25 MED ORDER — ONDANSETRON HCL 4 MG PO TABS
4.0000 mg | ORAL_TABLET | Freq: Once | ORAL | Status: DC
  Filled 2012-10-25: qty 1

## 2012-10-25 MED ORDER — IBUPROFEN 600 MG PO TABS: 600.0000 mg | ORAL_TABLET | Freq: Four times a day (QID) | ORAL | Status: DC | PRN

## 2012-10-25 MED ORDER — OXYTOCIN 10 UNIT/ML IJ SOLN
40.0000 [IU] | INTRAVENOUS | Status: DC | PRN
  Administered 2012-10-25: 40 [IU] via INTRAVENOUS

## 2012-10-25 MED ORDER — SCOPOLAMINE 1 MG/3DAYS TD PT72
MEDICATED_PATCH | TRANSDERMAL | Status: AC
  Filled 2012-10-25: qty 1

## 2012-10-25 MED ORDER — DIPHENHYDRAMINE HCL 25 MG PO CAPS
25.0000 mg | ORAL_CAPSULE | ORAL | Status: DC | PRN
  Administered 2012-10-26 (×3): 25 mg via ORAL
  Filled 2012-10-25 (×3): qty 1

## 2012-10-25 MED ORDER — CLINDAMYCIN PHOSPHATE 900 MG/50ML IV SOLN
900.0000 mg | Freq: Once | INTRAVENOUS | Status: DC
Start: 2012-10-25 — End: 2012-10-25

## 2012-10-25 MED ORDER — PHENYLEPHRINE 40 MCG/ML (10ML) SYRINGE FOR IV PUSH (FOR BLOOD PRESSURE SUPPORT): 80.0000 ug | PREFILLED_SYRINGE | INTRAVENOUS | Status: DC | PRN

## 2012-10-25 MED ORDER — DIPHENHYDRAMINE HCL 50 MG/ML IJ SOLN: 12.5000 mg | INTRAMUSCULAR | Status: DC | PRN

## 2012-10-25 MED ORDER — ONDANSETRON HCL 4 MG/2ML IJ SOLN
INTRAMUSCULAR | Status: AC
  Filled 2012-10-25: qty 2

## 2012-10-25 MED ORDER — OXYTOCIN 10 UNIT/ML IJ SOLN
INTRAMUSCULAR | Status: AC
  Filled 2012-10-25: qty 1

## 2012-10-25 MED ORDER — MORPHINE SULFATE 0.5 MG/ML IJ SOLN
INTRAMUSCULAR | Status: AC
  Filled 2012-10-25: qty 10

## 2012-10-25 MED ORDER — ONDANSETRON HCL 4 MG/2ML IJ SOLN: 4.0000 mg | Freq: Three times a day (TID) | INTRAMUSCULAR | Status: DC | PRN

## 2012-10-25 MED ORDER — DEXTROSE 5 % IV SOLN
1.0000 ug/kg/h | INTRAVENOUS | Status: DC | PRN
  Filled 2012-10-25: qty 2

## 2012-10-25 MED ORDER — OXYTOCIN 40 UNITS IN LACTATED RINGERS INFUSION - SIMPLE MED: 62.5000 mL/h | INTRAVENOUS | Status: DC

## 2012-10-25 MED ORDER — LACTATED RINGERS IV SOLN: 500.0000 mL | INTRAVENOUS | Status: DC | PRN

## 2012-10-25 MED ORDER — LACTATED RINGERS IV SOLN: 500.0000 mL | Freq: Once | INTRAVENOUS | Status: DC

## 2012-10-25 MED ORDER — MEPERIDINE HCL 25 MG/ML IJ SOLN
INTRAMUSCULAR | Status: AC
  Filled 2012-10-25: qty 1

## 2012-10-25 MED ORDER — PHENYLEPHRINE 40 MCG/ML (10ML) SYRINGE FOR IV PUSH (FOR BLOOD PRESSURE SUPPORT)
80.0000 ug | PREFILLED_SYRINGE | INTRAVENOUS | Status: DC | PRN
  Administered 2012-10-25: 80 ug via INTRAVENOUS

## 2012-10-25 MED ORDER — KETOROLAC TROMETHAMINE 30 MG/ML IJ SOLN: 30.0000 mg | Freq: Four times a day (QID) | INTRAMUSCULAR | Status: AC | PRN

## 2012-10-25 MED ORDER — LIDOCAINE HCL (PF) 1 % IJ SOLN
INTRAMUSCULAR | Status: DC | PRN
  Administered 2012-10-25: 5 mL

## 2012-10-25 MED ORDER — EPHEDRINE 5 MG/ML INJ
INTRAVENOUS | Status: AC
  Filled 2012-10-25: qty 4

## 2012-10-25 MED ORDER — LIDOCAINE-EPINEPHRINE (PF) 2 %-1:200000 IJ SOLN
INTRAMUSCULAR | Status: AC
  Filled 2012-10-25: qty 20

## 2012-10-25 MED ORDER — SODIUM CHLORIDE 0.9 % IV SOLN: 250.0000 mL | INTRAVENOUS | Status: DC | PRN

## 2012-10-25 SURGICAL SUPPLY — 34 items
BENZOIN TINCTURE PRP APPL 2/3 (GAUZE/BANDAGES/DRESSINGS) ×2 IMPLANT
CLAMP CORD UMBIL (MISCELLANEOUS) IMPLANT
CLOTH BEACON ORANGE TIMEOUT ST (SAFETY) ×2 IMPLANT
CONTAINER PREFILL 10% NBF 15ML (MISCELLANEOUS) IMPLANT
DRAPE LG THREE QUARTER DISP (DRAPES) ×2 IMPLANT
DRSG OPSITE 6X11 MED (GAUZE/BANDAGES/DRESSINGS) ×2 IMPLANT
DRSG OPSITE POSTOP 4X10 (GAUZE/BANDAGES/DRESSINGS) ×2 IMPLANT
DURAPREP 26ML APPLICATOR (WOUND CARE) ×2 IMPLANT
ELECT REM PT RETURN 9FT ADLT (ELECTROSURGICAL) ×2
ELECTRODE REM PT RTRN 9FT ADLT (ELECTROSURGICAL) ×1 IMPLANT
EXTRACTOR VACUUM M CUP 4 TUBE (SUCTIONS) IMPLANT
GLOVE BIO SURGEON STRL SZ7.5 (GLOVE) ×2 IMPLANT
GLOVE BIOGEL PI IND STRL 7.5 (GLOVE) ×1 IMPLANT
GLOVE BIOGEL PI INDICATOR 7.5 (GLOVE) ×1
GOWN STRL REIN XL XLG (GOWN DISPOSABLE) ×4 IMPLANT
KIT ABG SYR 3ML LUER SLIP (SYRINGE) IMPLANT
NEEDLE HYPO 25X5/8 SAFETYGLIDE (NEEDLE) IMPLANT
NS IRRIG 1000ML POUR BTL (IV SOLUTION) ×2 IMPLANT
PACK C SECTION WH (CUSTOM PROCEDURE TRAY) ×2 IMPLANT
PAD OB MATERNITY 4.3X12.25 (PERSONAL CARE ITEMS) ×2 IMPLANT
RETRACTOR WND ALEXIS 25 LRG (MISCELLANEOUS) ×1 IMPLANT
RTRCTR WOUND ALEXIS 25CM LRG (MISCELLANEOUS) ×2
STRIP CLOSURE SKIN 1/2X4 (GAUZE/BANDAGES/DRESSINGS) ×2 IMPLANT
SUT CHROMIC 2 0 CT 1 (SUTURE) ×2 IMPLANT
SUT MNCRL AB 3-0 PS2 27 (SUTURE) ×2 IMPLANT
SUT PLAIN 0 NONE (SUTURE) IMPLANT
SUT PLAIN 2 0 XLH (SUTURE) ×2 IMPLANT
SUT VIC AB 0 CT1 36 (SUTURE) ×2 IMPLANT
SUT VIC AB 0 CTX 36 (SUTURE) ×4
SUT VIC AB 0 CTX36XBRD ANBCTRL (SUTURE) ×4 IMPLANT
SYR 50ML LL SCALE MARK (SYRINGE) ×4 IMPLANT
TOWEL OR 17X24 6PK STRL BLUE (TOWEL DISPOSABLE) ×6 IMPLANT
TRAY FOLEY CATH 14FR (SET/KITS/TRAYS/PACK) ×2 IMPLANT
WATER STERILE IRR 1000ML POUR (IV SOLUTION) ×2 IMPLANT

## 2012-10-25 NOTE — Anesthesia Preprocedure Evaluation (Signed)
Anesthesia Evaluation  Patient identified by MRN, date of birth, ID band Patient awake    Reviewed: Allergy & Precautions, H&P , Patient's Chart, lab work & pertinent test results  Airway Mallampati: III TM Distance: >3 FB Neck ROM: full    Dental no notable dental hx.    Pulmonary neg pulmonary ROS, asthma ,  breath sounds clear to auscultation  Pulmonary exam normal       Cardiovascular negative cardio ROS  Rhythm:regular Rate:Normal     Neuro/Psych  Headaches, negative neurological ROS  negative psych ROS   GI/Hepatic negative GI ROS, Neg liver ROS,   Endo/Other  negative endocrine ROSMorbid obesity  Renal/GU negative Renal ROS     Musculoskeletal   Abdominal   Peds  Hematology negative hematology ROS (+)   Anesthesia Other Findings Sickle cell trait     History of chicken pox        Yeast infection     Ovarian cyst        Amenorrhea     Monilia infection        Migraine   Maxalt in the past'Excedrin or Ibuprofen Asthma    Reproductive/Obstetrics (+) Pregnancy                           Anesthesia Physical Anesthesia Plan  ASA: III  Anesthesia Plan: Epidural   Post-op Pain Management:    Induction:   Airway Management Planned:   Additional Equipment:   Intra-op Plan:   Post-operative Plan:   Informed Consent: I have reviewed the patients History and Physical, chart, labs and discussed the procedure including the risks, benefits and alternatives for the proposed anesthesia with the patient or authorized representative who has indicated his/her understanding and acceptance.     Plan Discussed with:   Anesthesia Plan Comments:         Anesthesia Quick Evaluation

## 2012-10-25 NOTE — Progress Notes (Signed)
ANTIBIOTIC CONSULT NOTE - INITIAL  Pharmacy Consult for Gentamicin Indication: Surgery Px  Allergies  Allergen Reactions  . Penicillins Rash    Patient Measurements: Height: 5\' 5"  (165.1 cm) Weight: 235 lb 3.2 oz (106.686 kg) IBW/kg (Calculated) : 57 Adjusted Body Weight: 72 kg  Vital Signs: Temp: 99.6 F (37.6 C) (06/25 2045) Temp src: Axillary (06/25 2045) BP: 141/60 mmHg (06/25 2114) Pulse Rate: 98 (06/25 2114)  Labs:  Recent Labs  10/25/12 0930 10/25/12 1144  WBC  --  11.3*  HGB  --  11.2*  PLT  --  201  LABCREA 22.49  --    No results found for this basename: GENTTROUGH, GENTPEAK, GENTRANDOM,  in the last 72 hours   Microbiology: No results found for this or any previous visit (from the past 720 hour(s)).  Medications:  Clindamycin 900 mg IV x 1  Assessment: 37 y.o. female G1P0000 at [redacted]w[redacted]d. Will dose for one time surgery Px.    Plan:  Gentamicin 360 mg with clindamycin 900mg  IV x 1 Prior to surgery.   Leisa Lenz, Jakevious Hollister Scarlett 10/25/2012,9:53 PM

## 2012-10-25 NOTE — H&P (Signed)
Kathryn Madden is a 37 y.o. female, G1P0 at [redacted]w[redacted]d weeks, presenting for UCs q3-5 min, strong.  Denies VB, LOF, recent fever, resp or GI c/o's, UTI or PIH s/s. GFM. Desires waterbirth, has doula.   Patient Active Problem List   Diagnosis Date Noted  . Allergy to penicillin 03/14/2012  . Sickle cell trait   . Migraine   . History of chicken pox   . Ovarian cyst   . Amenorrhea     History of present pregnancy: Patient entered care at 11 weeks.   EDC of 10/23/12 was established by LMP.   Anatomy scan:  19 weeks, with normal findings and an posterior placenta.   Additional [place] evaluations:  [redacted]w[redacted]d for growth and S>D - EFW 60th%ile; AFI 50th%ile.   Significant prenatal events:  none   Last evaluation:  10/23/12 at 40 weeks  OB History   Grav Para Term Preterm Abortions TAB SAB Ect Mult Living   1 0 0 0 0 0 0 0 0 0      Past Medical History  Diagnosis Date  . Sickle cell trait   . History of chicken pox   . Yeast infection   . Ovarian cyst   . Amenorrhea   . Monilia infection   . Migraine     Maxalt in the past'Excedrin or Ibuprofen  . Asthma    Past Surgical History  Procedure Laterality Date  . Wisdom tooth extraction     Family History: family history includes Angina in her mother; Arthritis in her maternal grandmother and mother; Breast cancer in her maternal aunt; Diabetes type II in her father; Heart disease in her mother; Hyperlipidemia in her father and mother; Hypertension in her brother, father, and mother; Hypothyroidism in her mother; and Migraines in her mother. Social History:  reports that she has never smoked. She has never used smokeless tobacco. She reports that she does not drink alcohol or use illicit drugs.   Prenatal Transfer Tool  Maternal Diabetes: No Genetic Screening: Normal Maternal Ultrasounds/Referrals: Declined Fetal Ultrasounds or other Referrals:  None Maternal Substance Abuse:  No Significant Maternal Medications:  None Significant Maternal Lab  Results: Lab values include: Group B Strep negative    ROS: see HPI above, all other systems are negative  Allergies  Allergen Reactions  . Penicillins Rash     Dilation: 5.5 Effacement (%): 80 Station: -3 Exam by:: J. Ansel Ferrall, CNM Blood pressure 158/87, pulse 76, temperature 98.3 F (36.8 C), temperature source Oral, resp. rate 18, height 5\' 5"  (1.651 m), weight 235 lb 3.2 oz (106.686 kg), last menstrual period 01/17/2012.  Chest clear Heart RRR without murmur Abd gravid, NT Ext: 1+ edema in LE  FHR: not tracing well, reassuring UCs:  3-5 min  Prenatal labs: ABO, Rh: B/POS/-- (11/11 1209) Antibody: NEG (11/11 1209) Rubella:   Immune RPR: NON REAC (11/11 1209)  HBsAg: NEGATIVE (11/11 1209)  HIV: NON REACTIVE (11/11 1209)  GBS:  Neg Sickle cell/Hgb electrophoresis:  n/a Pap:  12/30/11 WNL GC:  neg Chlamydia:  neg Genetic screenings:  n/a Glucola:  134 Other:  CMV neg; Parvovirus IgG pos  Admission labs PCR - too low to calculate   Assessment/Plan: IUP at [redacted]w[redacted]d Active labor Waterbirth/Doula GBS neg  Admit to BS per consult with Dr. Estanislado Pandy as attending Routine [facility] orders IA monitoring   Rowan Blase, MSN 10/25/2012, 9:28 AM

## 2012-10-25 NOTE — Anesthesia Procedure Notes (Signed)
Epidural Patient location during procedure: OB Start time: 10/25/2012 9:09 PM  Staffing Anesthesiologist: Angus Seller., Harrell Gave. Performed by: anesthesiologist   Preanesthetic Checklist Completed: patient identified, site marked, surgical consent, pre-op evaluation, timeout performed, IV checked, risks and benefits discussed and monitors and equipment checked  Epidural Patient position: sitting Prep: site prepped and draped and DuraPrep Patient monitoring: continuous pulse ox and blood pressure Approach: midline Injection technique: LOR air and LOR saline  Needle:  Needle type: Tuohy  Needle gauge: 17 G Needle length: 9 cm and 9 Needle insertion depth: 7 cm Catheter type: closed end flexible Catheter size: 19 Gauge Catheter at skin depth: 13 cm Test dose: negative  Assessment Events: blood not aspirated, injection not painful, no injection resistance, negative IV test and no paresthesia  Additional Notes Patient identified.  Risk benefits discussed including failed block, incomplete pain control, headache, nerve damage, paralysis, blood pressure changes, nausea, vomiting, reactions to medication both toxic or allergic, and postpartum back pain.  Patient expressed understanding and wished to proceed.  All questions were answered.  Sterile technique used throughout procedure and epidural site dressed with sterile barrier dressing. No paresthesia or other complications noted.The patient did not experience any signs of intravascular injection such as tinnitus or metallic taste in mouth nor signs of intrathecal spread such as rapid motor block. Please see nursing notes for vital signs.

## 2012-10-25 NOTE — MAU Note (Signed)
Patient states she is having contractions every 5-7 minutes. Denies bleeding or leaking and reports good fetal movement.  

## 2012-10-25 NOTE — MAU Note (Signed)
Pt states since 0130 this am has had ctx's, now q5-7 minutes apart. Rates pain 10/10 with u/c's. Denies bleeding or lof.

## 2012-10-25 NOTE — Op Note (Addendum)
Cesarean Section Procedure Note  Indications: P0 at 82 2/7wks with FTD and recurrent decels   Pre-operative Diagnosis: Decels and FTD   Post-operative Diagnosis: Decels and FTD  Procedure: CESAREAN SECTION  Surgeon: Osborn Coho, MD   Assistants: Denny Levy, CNM  Anesthesia: Epidural  Anesthesiologist: Brayton Caves, MD  Procedure Details  The patient was taken to the operating room secondary to FTD and decels after the risks, benefits, complications, treatment options, and expected outcomes were discussed with the patient.  The patient concurred with the proposed plan, giving informed consent which was signed and witnessed. The patient was taken to Operating Room 2, identified as Kathryn Madden and the procedure verified as C-Section Delivery. A Time Out was held and the above information confirmed.  After induction of anesthesia by obtaining a surgical level via epidural, the patient was prepped and draped in the usual sterile manner. A Pfannenstiel skin incision was made and carried down through the subcutaneous tissue to the underlying layer of fascia.  The fascia was incised bilaterally and extended transversely bilaterally with the Mayo scissors. Kocher clamps were placed on the inferior aspect of the fascial incision and the underlying rectus muscle was separated from the fascia. The same was done on the superior aspect of the fascial incision.  The peritoneum was identified, entered bluntly and extended manually. An Alexis retractor was placed for self-retaining retraction and a low transverse uterine incision was made with the scalpel above the bladder and extended bilaterally with the bandage scissors.  The infant was delivered in vertex position without difficulty.  After the umbilical cord was clamped and cut, the infant was handed to the awaiting pediatricians.  Cord blood was obtained for evaluation.  The placenta was removed intact and appeared to be within normal limits.  The uterus was cleared of all clots and debris. The uterine incision was closed with running interlocking sutures of 0 Vicryl and a second imbricating layer was performed as well.   There were two uterine extensions that were repaired in a similar fashion.  Bilateral tubes and ovaries appeared to be within normal limits.  Good hemostasis was noted.  Copious irrigation was performed until clear.  The peritoneum was repaired with 2-0 chromic via a running suture.  The fascia was reapproximated with a running suture of 0 Vicryl. The subcutaneous tissue was reapproximated with 3 interrupted sutures of 2-0 plain.  The skin was reapproximated with a subcuticular suture of 3-0 monocryl.  Steristrips were applied.  Instrument, sponge, and needle counts were correct prior to abdominal closure and at the conclusion of the case.  The patient was awaiting transfer to the recovery room in good condition.  Findings: Live female infant with Apgars 8 at one minute and 9 at five minutes.  Normal appearing bilateral ovaries and fallopian tubes were noted. Thick meconium. Art Gas pH 7.03.  Estimated Blood Loss:  600 ml         Drains: foley to gravity with 100 cc of concentrated urine         Total IV Fluids: 700 ml         Specimens to Pathology: Placenta labeled and stored in refrigerator on L&D for patient upon discharge.  She refused placenta going to path because she wants to do placental encapsulation.         Complications:  None; patient tolerated the procedure well.         Disposition: PACU - hemodynamically stable.  Condition: stable  Attending Attestation: I performed the procedure.

## 2012-10-25 NOTE — Transfer of Care (Signed)
Immediate Anesthesia Transfer of Care Note  Patient: Kathryn Madden  Procedure(s) Performed: Procedure(s): CESAREAN SECTION (N/A)  Patient Location: PACU  Anesthesia Type:epidural  Level of Consciousness: awake, alert  and oriented  Airway & Oxygen Therapy: Patient Spontanous Breathing  Post-op Assessment: Report given to PACU RN and Post -op Vital signs reviewed and stable  Post vital signs: Reviewed and stable  Complications: No apparent anesthesia complications

## 2012-10-25 NOTE — OR Nursing (Signed)
Patient refused to allow placenta to go to pathology.  Per Dr. Su Hilt, placenta was labeled and sent to L&D to hold for patient.

## 2012-10-26 ENCOUNTER — Encounter (HOSPITAL_COMMUNITY): Payer: Self-pay | Admitting: Obstetrics and Gynecology

## 2012-10-26 LAB — CBC
Hemoglobin: 10.7 g/dL — ABNORMAL LOW (ref 12.0–15.0)
MCHC: 35.8 g/dL (ref 30.0–36.0)
Platelets: 224 10*3/uL (ref 150–400)

## 2012-10-26 MED ORDER — PRENATAL MULTIVITAMIN CH
1.0000 | ORAL_TABLET | Freq: Every day | ORAL | Status: DC
  Administered 2012-10-26 – 2012-10-27 (×2): 1 via ORAL
  Filled 2012-10-26 (×2): qty 1

## 2012-10-26 MED ORDER — MENTHOL 3 MG MT LOZG: 1.0000 | LOZENGE | OROMUCOSAL | Status: DC | PRN

## 2012-10-26 MED ORDER — DIPHENHYDRAMINE HCL 25 MG PO CAPS
25.0000 mg | ORAL_CAPSULE | Freq: Four times a day (QID) | ORAL | Status: DC | PRN
  Filled 2012-10-26: qty 1

## 2012-10-26 MED ORDER — DIBUCAINE 1 % RE OINT: 1.0000 "application " | TOPICAL_OINTMENT | RECTAL | Status: DC | PRN

## 2012-10-26 MED ORDER — SIMETHICONE 80 MG PO CHEW: 80.0000 mg | CHEWABLE_TABLET | ORAL | Status: DC | PRN

## 2012-10-26 MED ORDER — LACTATED RINGERS IV SOLN
INTRAVENOUS | Status: DC
  Administered 2012-10-26: 01:00:00 via INTRAVENOUS

## 2012-10-26 MED ORDER — SENNOSIDES-DOCUSATE SODIUM 8.6-50 MG PO TABS
2.0000 | ORAL_TABLET | Freq: Every day | ORAL | Status: DC
  Administered 2012-10-26: 2 via ORAL

## 2012-10-26 MED ORDER — SIMETHICONE 80 MG PO CHEW
80.0000 mg | CHEWABLE_TABLET | Freq: Three times a day (TID) | ORAL | Status: DC
  Administered 2012-10-26 – 2012-10-27 (×5): 80 mg via ORAL

## 2012-10-26 MED ORDER — LANOLIN HYDROUS EX OINT: 1.0000 "application " | TOPICAL_OINTMENT | CUTANEOUS | Status: DC | PRN

## 2012-10-26 MED ORDER — OXYTOCIN 40 UNITS IN LACTATED RINGERS INFUSION - SIMPLE MED: 62.5000 mL/h | INTRAVENOUS | Status: AC

## 2012-10-26 MED ORDER — TETANUS-DIPHTH-ACELL PERTUSSIS 5-2.5-18.5 LF-MCG/0.5 IM SUSP: 0.5000 mL | Freq: Once | INTRAMUSCULAR | Status: DC

## 2012-10-26 MED ORDER — ONDANSETRON HCL 4 MG PO TABS: 4.0000 mg | ORAL_TABLET | ORAL | Status: DC | PRN

## 2012-10-26 MED ORDER — ZOLPIDEM TARTRATE 5 MG PO TABS: 5.0000 mg | ORAL_TABLET | Freq: Every evening | ORAL | Status: DC | PRN

## 2012-10-26 MED ORDER — IBUPROFEN 600 MG PO TABS
600.0000 mg | ORAL_TABLET | Freq: Four times a day (QID) | ORAL | Status: DC
  Administered 2012-10-26 – 2012-10-27 (×5): 600 mg via ORAL
  Filled 2012-10-26 (×6): qty 1

## 2012-10-26 MED ORDER — ONDANSETRON HCL 4 MG/2ML IJ SOLN: 4.0000 mg | INTRAMUSCULAR | Status: DC | PRN

## 2012-10-26 MED ORDER — OXYCODONE-ACETAMINOPHEN 5-325 MG PO TABS
1.0000 | ORAL_TABLET | ORAL | Status: DC | PRN
  Administered 2012-10-26 – 2012-10-27 (×2): 1 via ORAL
  Filled 2012-10-26 (×2): qty 1

## 2012-10-26 MED ORDER — WITCH HAZEL-GLYCERIN EX PADS: 1.0000 "application " | MEDICATED_PAD | CUTANEOUS | Status: DC | PRN

## 2012-10-26 NOTE — Anesthesia Postprocedure Evaluation (Signed)
  Anesthesia Post Note  Patient: Kathryn Madden  Procedure(s) Performed: Procedure(s) (LRB): CESAREAN SECTION (N/A)  Anesthesia type: Epidural  Patient location: PACU  Post pain: Pain level controlled  Post assessment: Post-op Vital signs reviewed  Last Vitals:  Filed Vitals:   10/25/12 2315  BP: 125/82  Pulse: 82  Temp:   Resp: 22    Post vital signs: Reviewed  Level of consciousness: awake  Complications: No apparent anesthesia complications

## 2012-10-26 NOTE — Anesthesia Postprocedure Evaluation (Signed)
  Anesthesia Post-op Note  Patient: Kathryn Madden  Procedure(s) Performed: Procedure(s): CESAREAN SECTION (N/A)  Patient Location: Mother/Baby  Anesthesia Type:Epidural  Level of Consciousness: awake, alert , oriented and patient cooperative  Airway and Oxygen Therapy: Patient Spontanous Breathing  Post-op Pain: mild  Post-op Assessment: Patient's Cardiovascular Status Stable, Respiratory Function Stable, Patent Airway, No signs of Nausea or vomiting, Adequate PO intake and Pain level controlled  Post-op Vital Signs: Reviewed and stable  Complications: No apparent anesthesia complications

## 2012-10-26 NOTE — Progress Notes (Signed)
Subjective: Postpartum Day 1: Cesarean Delivery due to FTD and recurrent decels Patient up ad lib, reports no syncope or dizziness. Feeding:  Breastfeeding Contraceptive plan:  IUD  Objective: Vital signs in last 24 hours: Temp:  [97.7 F (36.5 C)-99.6 F (37.6 C)] 98.8 F (37.1 C) (06/26 0809) Pulse Rate:  [70-142] 90 (06/26 0809) Resp:  [15-29] 20 (06/26 0809) BP: (87-157)/(45-94) 118/67 mmHg (06/26 0809) SpO2:  [92 %-100 %] 96 % (06/26 0809)  Physical Exam:  General: alert, cooperative and no distress Lochia: appropriate Uterine Fundus: firm Incision: healing well DVT Evaluation: No evidence of DVT seen on physical exam. Negative Homan's sign. JP drain:   n/a   Recent Labs  10/25/12 1144 10/26/12 0625  HGB 11.2* 10.7*  HCT 31.5* 29.9*    Assessment/Plan: Status post Cesarean section day 1. Doing well postoperatively.  Continue current care.     Haroldine Laws 10/26/2012, 9:09 AM

## 2012-10-26 NOTE — Progress Notes (Signed)
  Subjective: Called to pt room due to involuntary bearing down.  Objective: BP 119/69  Pulse 84  Temp(Src) 98.2 F (36.8 C) (Axillary)  Resp 20  Ht 5\' 5"  (1.651 m)  Wt 235 lb 3.2 oz (106.686 kg)  BMI 39.14 kg/m2  SpO2 90%  LMP 01/17/2012 I/O last 3 completed shifts: In: 2006.3 [I.V.:2006.3] Out: 1800 [Urine:1200; Blood:600] Total I/O In: 470.8 [I.V.:470.8] Out: 200 [Urine:200]  FHT:  BL 260, mod variability, variable noted UC:   regular, every 2-3 minutes  SVE:   9.5 / 100 / +1  Assessment / Plan:  Labor: Active labor Preeclampsia: no s/s Fetal Wellbeing: Pain Control: Breathing, relaxation, tub I/D: GBS neg; SROM at 1343 Anticipated MOD: SVD   Kiyoko Mcguirt 10/26/2012, 10:15 AM

## 2012-10-26 NOTE — Progress Notes (Signed)
  Subjective: Called to room because pt is feeling "pushy"  Objective: BP 119/69  Pulse 84  Temp(Src) 98.2 F (36.8 C) (Axillary)  Resp 20  Ht 5\' 5"  (1.651 m)  Wt 235 lb 3.2 oz (106.686 kg)  BMI 39.14 kg/m2  SpO2 90%  LMP 01/17/2012 I/O last 3 completed shifts: In: 2006.3 [I.V.:2006.3] Out: 1800 [Urine:1200; Blood:600] Total I/O In: 470.8 [I.V.:470.8] Out: 200 [Urine:200]  FHT:  IA appropriate UC:   Not being traced.  Palpation shows about every 2-3 min  SVE:   8 cm / 100%  Assessment / Plan:  Labor: Active labor Preeclampsia: no s/s Fetal Wellbeing: IA reassuring Pain Control: Breathing, relaxation, tub I/D: GBS neg, not ruptured Anticipated MOD: SVD   Kathryn Madden 10/26/2012, 10:09 AM

## 2012-10-26 NOTE — Progress Notes (Signed)
Patient ID: Kathryn Madden, female   DOB: 01-29-1976, 37 y.o.   MRN: [id number] Narrative Note: Arrived at pt's room around 2020 to assess pt's pushing. Pt complete at 1730, and pushing started around that time.  On my arrival to beside, pt pushing w/ great force/effort in Rt lateral position.  Intermittent auscultation during labor, so no monitors on when I arrived at bs.   SVE and fetus thought to be in LOT/LOP position. Observed pt push w/ next ctx and great effort noted w/ some movement, but when ctx over, station remained at +1 station.  Thick meconium noted on perineum and exam glove.  Pt's mom and doula at bs; doula working on [employer].  After observing that ctx, recommended c/s for FTD, secondary to pt's excellent effort, but lack of descent over 3 hours of pushing w/o an epidural.  Pt verbalized agreement with recommendation right away.   Pt w/ uncontrollable urge to push, and felt need to push w/ every ctx, so continued to push, as I stepped out of room to call Dr. Les Pou w/ status update at 2028. Before stepping out of room, RN's started working on inserting IV and prepping for OR, and were instructed to placed external monitors for continuous tracing.  Returned to room at 2030, and difficulty tracing during pushing/ctxs, and rec'd FSE to pt.  Pt agreeable after disc'd uncertainty of fetal status w/ interrupted external monitor. Monitor applied, and w/ next 2 ctxs, FHR w/ late decels to min in 80s for 1-1.5 min.  Strongly rec'd pt stop pushing despite urge and turned to Lt side, and Oxygen applied.  House supervisor had to be called for IV help.  Once inserted, received bolus. FHR stabilized in 150s once pt able to stop pushing, and spontaneous accels noted w/in next 5 min. After noting decels, Dr. Su Hilt viewed tracing and 2nd OR team called in to hasten delivery. Will place epidural now and CTO FHT closely.  R/b/a of c/s disc'd w/ pt which include but are not limited to:  anesthesia complications, bleeding, infection, and damage to surrounding organs.  Pt verbalized understanding.  MD to follow.  Rexene Edison, CNM

## 2012-10-27 MED ORDER — OXYCODONE-ACETAMINOPHEN 5-325 MG PO TABS: 1.0000 | ORAL_TABLET | ORAL | Status: AC | PRN

## 2012-10-27 MED ORDER — IBUPROFEN 600 MG PO TABS: 600.0000 mg | ORAL_TABLET | Freq: Four times a day (QID) | ORAL | Status: AC | PRN

## 2012-10-27 NOTE — Progress Notes (Signed)
CNA attempted to walk pt to car to ensure that carseat was put in car properly. Pt and FOB refuse to be walked to car.

## 2012-10-27 NOTE — Progress Notes (Signed)
D/C AVS for mother and baby and education completed at 37. RN to room to ask pt if she was able to complete birth certificate prior to dc. Stated to pt and FOB that the birth certificate is completed prior to D/C to home in most cases. FOB began to get upset with RN and stated that we could not make him stay here against his will. FOB states that he just graduated law school and that he knows the law. I reassured pt and FOB that I did not mean to make them upset and apologized. I stated that it was sometimes just easier to complete the birth certificate prior to D/C to home. Explained to pt and FOB that they are welcome to do what they choose and can call to be walked to there car when they are ready to go home.

## 2012-10-27 NOTE — Progress Notes (Signed)
Subjective: Postpartum Day 2: Cesarean Delivery Patient reports tolerating PO and no problems voiding.  Nipples are a little sore today.  Minimal flatus.  No BM yet.  Requests early d/c today; newborn has received d/c order.    Objective: Vital signs in last 24 hours: Temp:  [97.8 F (36.6 C)-98.6 F (37 C)] 98.1 F (36.7 C) (06/27 0628) Pulse Rate:  [79-91] 91 (06/27 0628) Resp:  [20] 20 (06/27 0628) BP: (122-129)/(61-83) 129/83 mmHg (06/27 0628) SpO2:  [95 %-97 %] 95 % (06/26 2205)  Physical Exam:  General: alert, cooperative, appears stated age and mildly obese CV: RRR Lungs: CTA B Abdomen: BS+x4; slightly distended; appropriately tender Lochia: appropriate, minimal Uterine Fundus: firm; u/-1 Incision: healing well, no significant drainage, honeycomb dsg intact; small amt of underlying yellowish/brown discharge noted under dsg DVT Evaluation: No evidence of DVT seen on physical exam. Negative Homan's sign. Calf/Ankle edema is present.   Recent Labs  10/25/12 1144 10/26/12 0625  HGB 11.2* 10.7*  HCT 31.5* 29.9*    Assessment/Plan: Status post primary low-transverse Cesarean section for FTD & FHR decelerations. Doing well postoperatively.  Discharge home with standard precautions and return to clinic in 4-6 weeks.  Ocie Stanzione H 10/27/2012, 10:20 AM

## 2012-10-27 NOTE — Discharge Summary (Signed)
Obstetric Discharge Summary Reason for Admission: onset of labor Prenatal Procedures: ultrasound Intrapartum Procedures: cesarean: low cervical, transverse and epidural Postpartum Procedures: none Complications-Operative and Postpartum: none  Temp:  [97.8 F (36.6 C)-98.6 F (37 C)] 98.1 F (36.7 C) (06/27 0628) Pulse Rate:  [79-91] 91 (06/27 0628) Resp:  [20] 20 (06/27 0628) BP: (122-129)/(61-83) 129/83 mmHg (06/27 0628) SpO2:  [95 %-97 %] 95 % (06/26 2205) Hemoglobin  Date Value Range Status  10/26/2012 10.7* 12.0 - 15.0 g/dL Final     HCT  Date Value Range Status  10/26/2012 29.9* 36.0 - 46.0 % Final    Hospital Course:  Hospital Course: Admitted in active labor at [redacted]w[redacted]d; cx=5-6cm/80% on admission. Doula accompanied pt and her husband, and desired waterbirth.  IA during labor.  Neg GBS, so pt declined saline lock. SROM at 1343.  Utilized tub throughout labor.  Progressed to fully dilated at 1730 without augmentation, and pt started pushing at that time. Pushed with great effort for about 3 hours, and no descent noted.  Thick meconium noted during pushing stage.  Pt agreed to c/s around 2020, and epidural placed around 2100, prior to transfer to OR.  Continuous monitoring w/ FSE around 2000 revealed FHR decels, and 2nd OR team called in to hasten delivery.  Patient and baby tolerated the procedure without difficulty; procedure performed under epidural anesthesia by Dr. Osborn Coho, MD, assisted by Denny Levy, CNM. Delivery time 2143 on 10/25/12. Infant to FTN. Mother and infant then had an uncomplicated postpartum course, with breast feeding going well. Mom's physical exam was WNL, and she was discharged home in stable condition. Contraception plan is for IUD.  She received adequate benefit from po pain medications. No BM since delivery; minimal flatus; VB light.  Pt requested early d/c on POD #2.  Pt deemed to have received full benefit of her hospital stay, and d/c'd home in stable  condition on 10/27/12. Pt's mom here from [place] for a few weeks to help and pt plans to remain OOW x12weeks.  Discharge Diagnoses: Term Pregnancy-delivered and s/p primary LTCS for Failure to Descend & FHR decelerations. Thick Meconium. Lactating.  Discharge Information: Date: 10/27/2012 POD#2 Activity: pelvic rest Diet: routine Medications:    Medication List    STOP taking these medications       ondansetron 8 MG disintegrating tablet  Commonly known as:  ZOFRAN-ODT      TAKE these medications       ibuprofen 600 MG tablet  Commonly known as:  ADVIL,MOTRIN  Take 1 tablet (600 mg total) by mouth every 6 (six) hours as needed for pain.     omeprazole 20 MG capsule  Commonly known as:  PRILOSEC  Take 20 mg by mouth daily.     oxyCODONE-acetaminophen 5-325 MG per tablet  Commonly known as:  PERCOCET/ROXICET  Take 1-2 tablets by mouth every 4 (four) hours as needed.     prenatal multivitamin Tabs  Take 1 tablet by mouth daily.       Condition: stable Instructions: refer to practice specific booklet Discharge to: home     Follow-up Information   Follow up with [facility]. Schedule an appointment as soon as possible for a visit in 6 weeks. (or call as needed with any questions or concerns)    Contact information:   [address] [place] [address] [phone]      Newborn Data: Live born  Information for the patient's newborn:  Gatchel, Girl  Fiona [[id number]]  female "Aamira" APGAR 8 ,9 ; weight 5lb 14.2 oz ;  Home with mother.  Dayana Dalporto H 10/27/2012, 10:15 AM

## 2013-10-24 IMAGING — US US OB LIMITED
1 series · 14 of 17 positions shown · non-contrast
Comparison: 02/28/2012

CLINICAL DATA: 13 weeks pregnancy, with leaking fluid.

ULTRAOUND OB LIMITED - [facility]

[Series 1: us ob follow up · 14 of 17 slices shown]
[im 1/17]
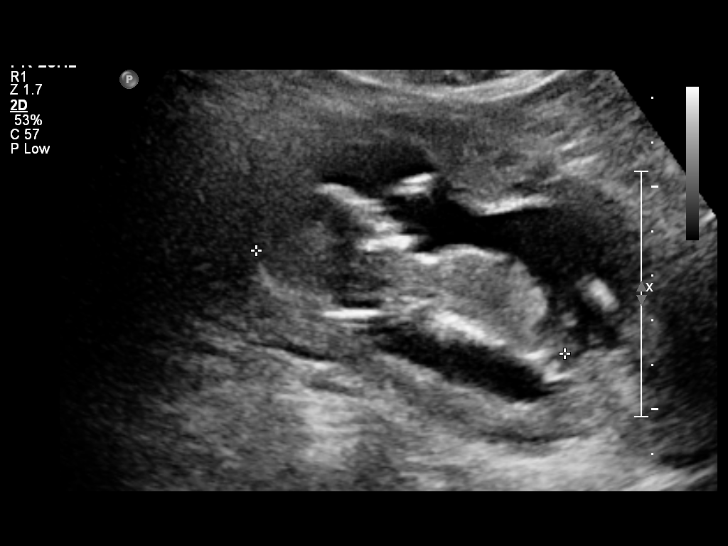
[im 2/17]
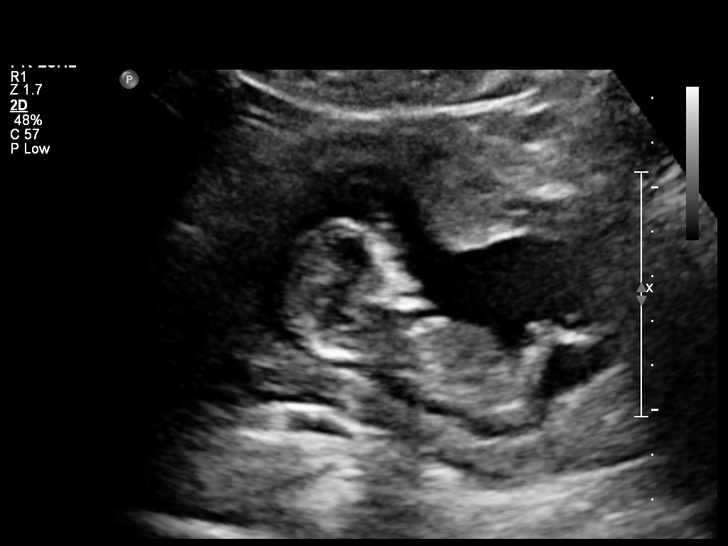
[im 4/17]
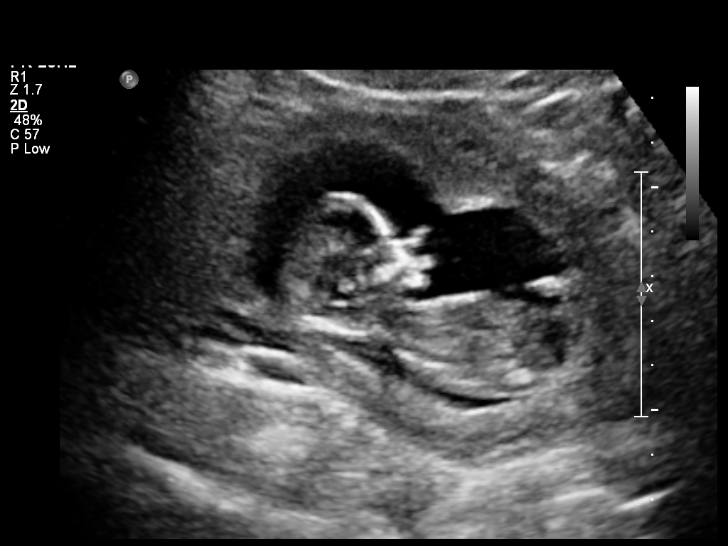
[im 5/17]
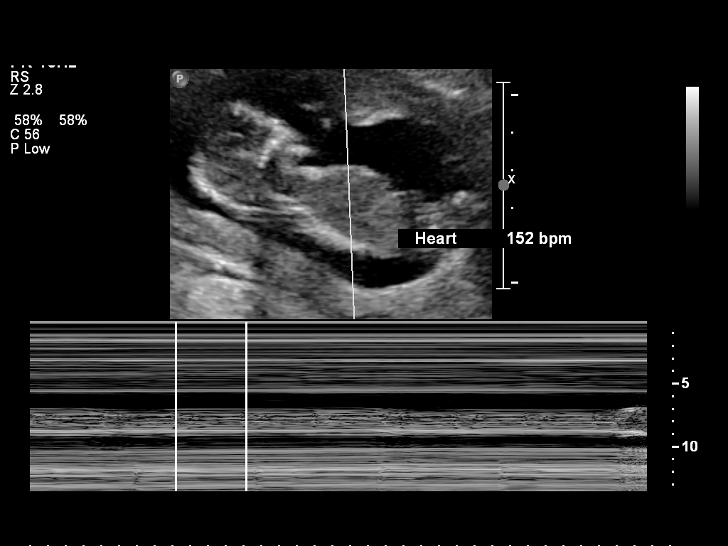
[im 6/17]
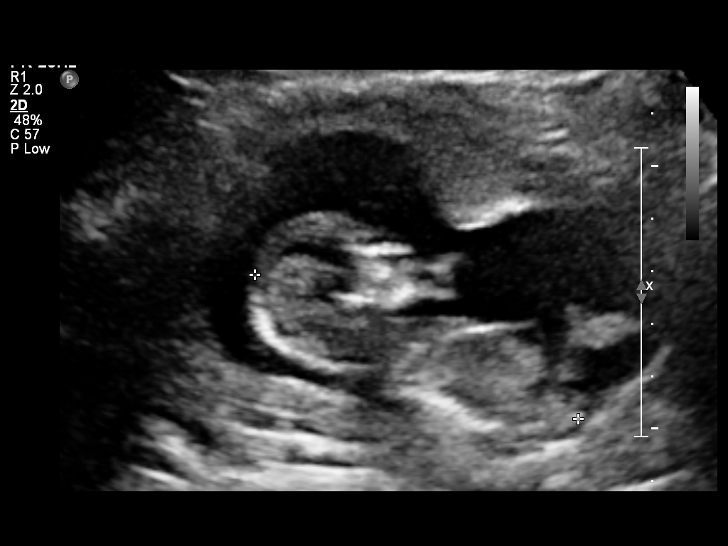
[im 7/17]
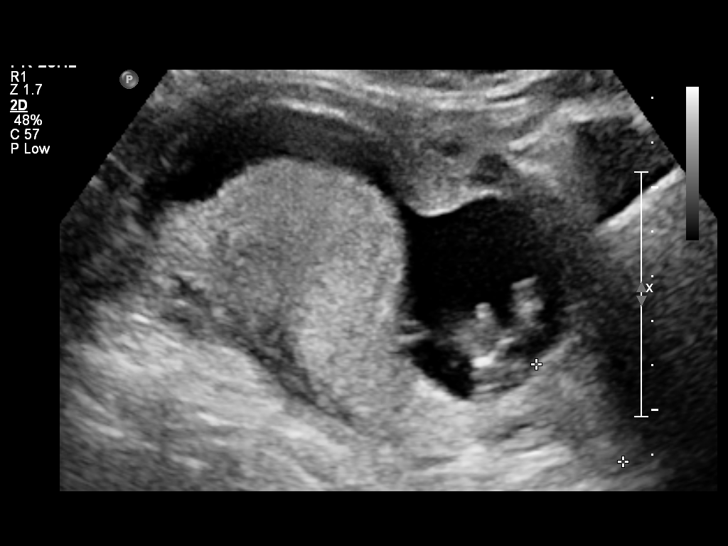
[im 8/17]
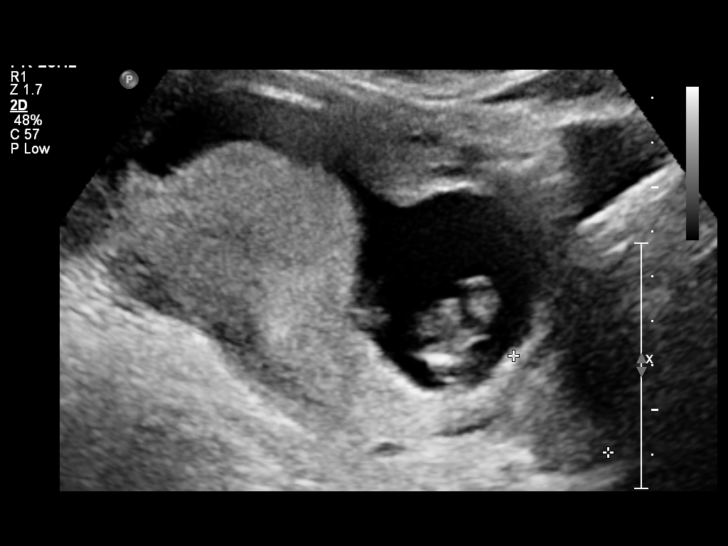
[im 10/17]
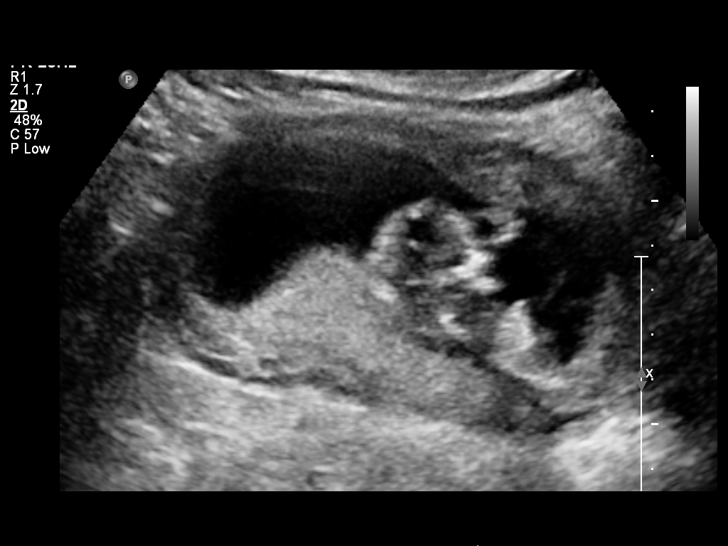
[im 11/17]
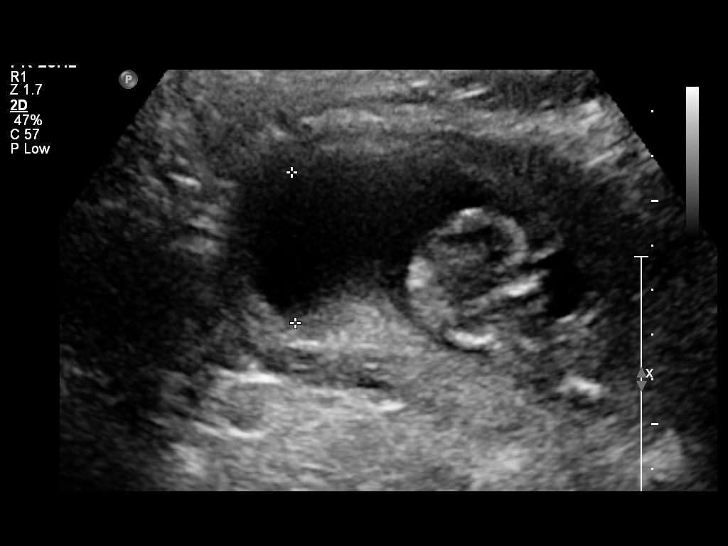
[im 12/17]
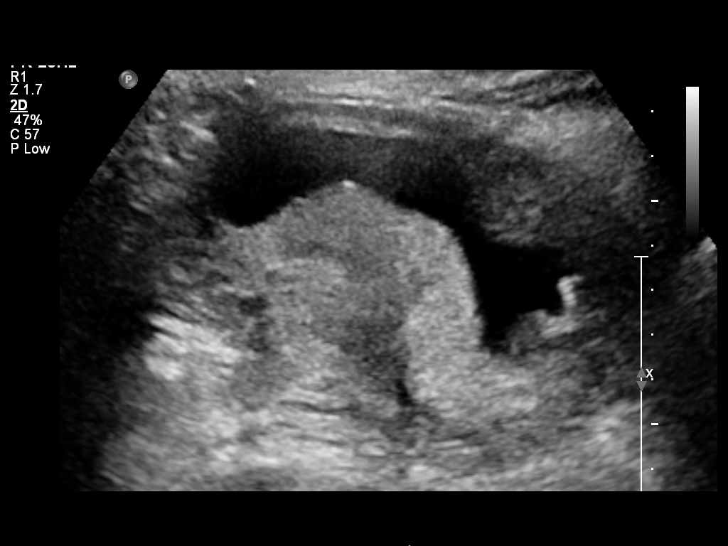
[im 13/17]
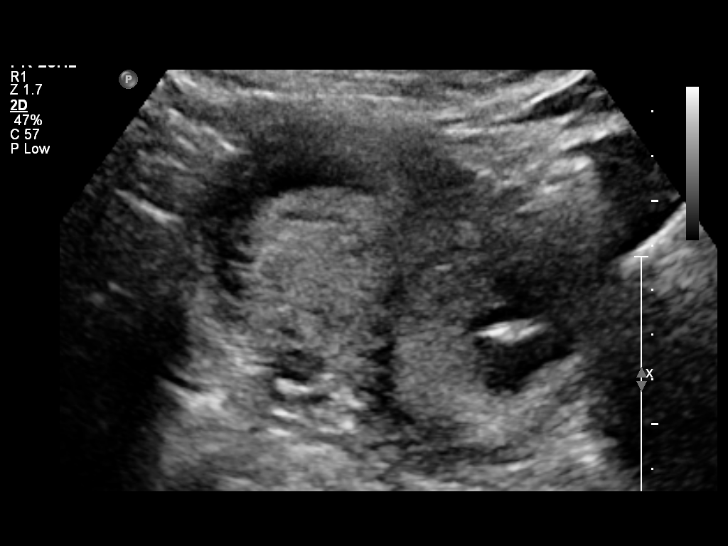
[im 14/17]
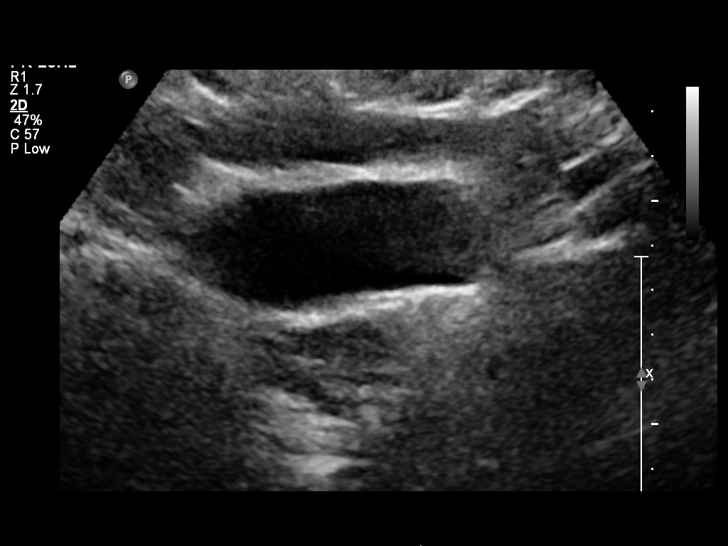
[im 16/17]
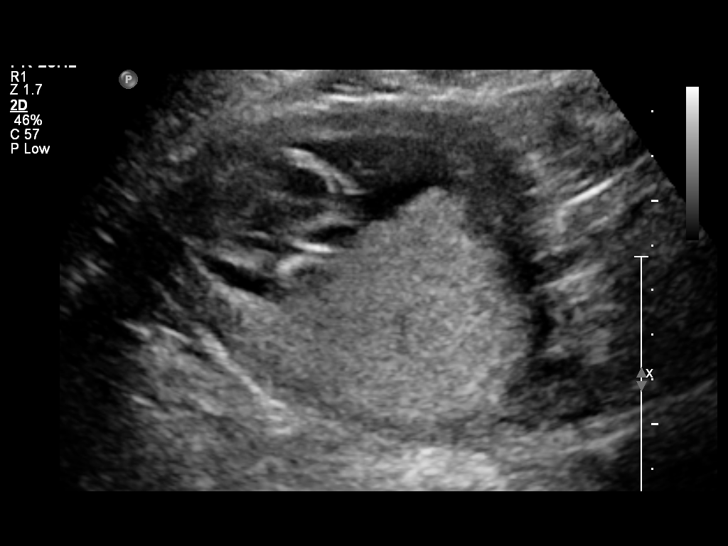
[im 17/17]
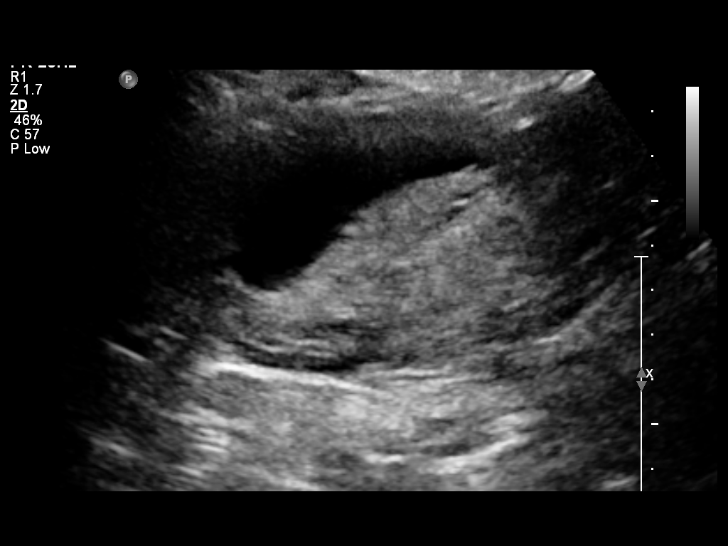

[14 of 17 positions shown; findings below may reference images not displayed]

FINDINGS: Single living intrauterine fetus noted with cardiac
activity of 152 beats per minute.  Posterior placenta noted without
previa.  Amniotic fluid volume currently appears within normal
limits, with a 3.4 cm pocket identified eccentric to the right.

Fetal crown-rump length is 67.1 mm compatible with 12 weeks 6 days
gestation.  This is congruent with the last menstrual period.

Maternal cervix measures 3.1 cm by my measurement.  No significant
funneling identified.  No fluid was seen in the cervical canal.

No maternal free pelvic fluid is observed.  No adnexal mass
identified.
IMPRESSION: 1.  Single living intrauterine pregnancy, with amniotic fluid
volume currently within normal limits, and with maternal cervix
measuring at 3.1 cm in length without following or fluid in the
endocervical canal.  No previa observed.  Fetal growth congruent
with the last menstrual period.

## 2014-03-04 ENCOUNTER — Encounter (HOSPITAL_COMMUNITY): Payer: Self-pay | Admitting: Obstetrics and Gynecology

## 2014-08-02 ENCOUNTER — Emergency Department (HOSPITAL_COMMUNITY)
Admission: EM | Admit: 2014-08-02 | Discharge: 2014-08-02 | Disposition: A | Discharge status: Home / Self Care | Payer: PRIVATE HEALTH INSURANCE | Attending: Emergency Medicine | Admitting: Emergency Medicine

## 2014-08-02 ENCOUNTER — Encounter (HOSPITAL_COMMUNITY): Payer: Self-pay | Admitting: *Deleted

## 2014-08-02 ENCOUNTER — Emergency Department (HOSPITAL_COMMUNITY): Payer: PRIVATE HEALTH INSURANCE

## 2014-08-02 DIAGNOSIS — M545 Low back pain, unspecified: Secondary | ICD-10-CM

## 2014-08-02 DIAGNOSIS — Z8742 Personal history of other diseases of the female genital tract: Secondary | ICD-10-CM | POA: Diagnosis not present

## 2014-08-02 DIAGNOSIS — Z862 Personal history of diseases of the blood and blood-forming organs and certain disorders involving the immune mechanism: Secondary | ICD-10-CM | POA: Diagnosis not present

## 2014-08-02 DIAGNOSIS — Z8619 Personal history of other infectious and parasitic diseases: Secondary | ICD-10-CM | POA: Insufficient documentation

## 2014-08-02 DIAGNOSIS — Z8679 Personal history of other diseases of the circulatory system: Secondary | ICD-10-CM | POA: Insufficient documentation

## 2014-08-02 DIAGNOSIS — Z880 Allergy status to penicillin: Secondary | ICD-10-CM | POA: Diagnosis not present

## 2014-08-02 DIAGNOSIS — Z3202 Encounter for pregnancy test, result negative: Secondary | ICD-10-CM | POA: Diagnosis not present

## 2014-08-02 DIAGNOSIS — Z79899 Other long term (current) drug therapy: Secondary | ICD-10-CM | POA: Insufficient documentation

## 2014-08-02 DIAGNOSIS — J45909 Unspecified asthma, uncomplicated: Secondary | ICD-10-CM | POA: Insufficient documentation

## 2014-08-02 LAB — URINALYSIS, ROUTINE W REFLEX MICROSCOPIC
Bilirubin Urine: NEGATIVE
Glucose, UA: NEGATIVE mg/dL
HGB URINE DIPSTICK: NEGATIVE
Ketones, ur: 15 mg/dL — AB
NITRITE: NEGATIVE
Protein, ur: NEGATIVE mg/dL
Specific Gravity, Urine: 1.035 — ABNORMAL HIGH (ref 1.005–1.030)
Urobilinogen, UA: 0.2 mg/dL (ref 0.0–1.0)
pH: 5.5 (ref 5.0–8.0)

## 2014-08-02 LAB — POC URINE PREG, ED: PREG TEST UR: NEGATIVE

## 2014-08-02 LAB — URINE MICROSCOPIC-ADD ON

## 2014-08-02 MED ORDER — MELOXICAM 7.5 MG PO TABS
7.5000 mg | ORAL_TABLET | Freq: Every day | ORAL | Status: AC
Start: 2014-08-02 — End: ?

## 2014-08-02 MED ORDER — KETOROLAC TROMETHAMINE 60 MG/2ML IM SOLN
60.0000 mg | Freq: Once | INTRAMUSCULAR | Status: AC
  Administered 2014-08-02: 60 mg via INTRAMUSCULAR
  Filled 2014-08-02: qty 2

## 2014-08-02 MED ORDER — METHOCARBAMOL 750 MG PO TABS: 750.0000 mg | ORAL_TABLET | Freq: Four times a day (QID) | ORAL | Status: AC | PRN

## 2014-08-02 NOTE — ED Notes (Signed)
Patient with reported onset of mid to lower back pain 2 weeks ago.  Sx have worsened to the point that she has altered gait.  No incontinence.  She has taken ibuprofen this morning w/o relief

## 2014-08-02 NOTE — ED Provider Notes (Signed)
CSN: [id number]     Arrival date & time 08/02/14  9147 History   First MD Initiated Contact with Patient 08/02/14 [phone]     Chief Complaint  Patient presents with  . Back Pain     (Consider location/radiation/quality/duration/timing/severity/associated sxs/prior Treatment) The history is provided by the patient.     Patient presents with low back pain x 2 weeks.  The pain has intermittently radiated into the left buttock and leg.  Pain is worse with walking and certain movements.  Does lift weights and has been doing more resistance training recently, but denies any known injuries.  Has used flexeril and tramadol with some relief.  Currently using lidocaine patch with relief.   Has had intermittent pain in this area since she had an epidural with the birth of her baby nearly two years ago.  Denies fevers, abdominal pain or tenderness, urinary, vaginal, or bowel symptoms.  Denies weakness or numbness of the leg.  Reports normal pulses in her legs. Patient is a physician with our practice.    Past Medical History  Diagnosis Date  . Sickle cell trait   . History of chicken pox   . Yeast infection   . Ovarian cyst   . Amenorrhea   . Monilia infection   . Migraine     [place] in the past'Excedrin or Ibuprofen  . Asthma   . Status post primary low transverse cesarean section 10/25/2012    NR FHT & FTD   Past Surgical History  Procedure Laterality Date  . Wisdom tooth extraction    . Cesarean section N/A 10/25/2012    Procedure: CESAREAN SECTION;  Surgeon: Purcell Nails, MD;  Location: [facility];  Service: Obstetrics;  Laterality: N/A;   Family History  Problem Relation Age of Onset  . Hypertension Mother   . Heart disease Mother   . Hypertension Father   . Hypertension Brother   . Hyperlipidemia Mother   . Hyperlipidemia Father   . Angina Mother   . Diabetes type II Father   . Breast cancer Maternal Aunt   . Arthritis Maternal Grandmother   . Arthritis Mother   . Migraines Mother    . Hypothyroidism Mother    History  Substance Use Topics  . Smoking status: Never Smoker   . Smokeless tobacco: Never Used  . Alcohol Use: No   OB History    Gravida Para Term Preterm AB TAB SAB Ectopic Multiple Living   0 0 0 0 0 0 1     Review of Systems  Constitutional: Negative for fever and chills.  Gastrointestinal: Negative.   Genitourinary: Negative for dysuria, urgency, frequency, vaginal bleeding, vaginal discharge, menstrual problem and pelvic pain.  Musculoskeletal: Positive for back pain.  Neurological: Negative for weakness and numbness.  Hematological: Does not bruise/bleed easily.  Psychiatric/Behavioral: Negative for self-injury.      Allergies  Penicillins  Home Medications   Prior to Admission medications   Medication Sig Start Date End Date Taking? Authorizing Provider  ibuprofen (ADVIL,MOTRIN) 600 MG tablet Take 1 tablet (600 mg total) by mouth every 6 (six) hours as needed for pain. 10/27/12   Candice Steelman, CNM  omeprazole (PRILOSEC) 20 MG capsule Take 20 mg by mouth daily.    Historical Provider, MD  oxyCODONE-acetaminophen (PERCOCET/ROXICET) 5-325 MG per tablet Take 1-2 tablets by mouth every 4 (four) hours as needed. 10/27/12   Candice Steelman, CNM  Prenatal Vit-Fe Fumarate-FA (PRENATAL MULTIVITAMIN) TABS Take 1 tablet by  mouth daily.    Historical Provider, MD   There were no vitals taken for this visit. Physical Exam  Constitutional: She appears well-developed and well-nourished. No distress.  HENT:  Head: Normocephalic and atraumatic.  Neck: Neck supple.  Pulmonary/Chest: Effort normal.  Musculoskeletal: She exhibits no edema.       Back:  Neurological: She is alert. She has normal strength. No sensory deficit.  Shuffling gait  Skin: She is not diaphoretic.  Nursing note and vitals reviewed.   ED Course  Procedures (including critical care time) Labs Review Labs Reviewed  URINALYSIS, ROUTINE W REFLEX MICROSCOPIC -  Abnormal; Notable for the following:    Color, Urine AMBER (*)    Specific Gravity, Urine 1.035 (*)    Ketones, ur 15 (*)    Leukocytes, UA SMALL (*)    All other components within normal limits  URINE MICROSCOPIC-ADD ON - Abnormal; Notable for the following:    Squamous Epithelial / LPF FEW (*)    All other components within normal limits  URINE CULTURE  POC URINE PREG, ED    Imaging Review Dg Lumbar Spine Complete  08/02/2014   CLINICAL DATA:  Low back pain and left leg pain.  EXAM: LUMBAR SPINE - COMPLETE 4+ VIEW  COMPARISON:  None.  FINDINGS: There is no evidence of lumbar spine fracture. Alignment is normal. Intervertebral disc spaces are maintained. No facet joint disease.  IMPRESSION: Normal lumbar spine.   Electronically Signed   By: Francene BoyersJames  Maxwell M.D.   On: 08/02/2014 10:06     EKG Interpretation None      MDM   Final diagnoses:  Midline low back pain without sciatica    Afebrile, nontoxic patient with mechanical low back pain. No red flags.  Xray negative.  Urine pregnancy negative.  UA sent for culture.   D/C home with mobic, robaxin.  PCP follow up.  Discussed result, findings, treatment, and follow up  with patient.  Pt given return precautions.  Pt verbalizes understanding and agrees with plan.        Trixie Dredgemily Suhaas Agena, PA-C 08/02/14 1240  Gwyneth SproutWhitney Plunkett, MD 08/03/14 [phone]

## 2014-08-02 NOTE — Discharge Instructions (Signed)
Read the information below.  Use the prescribed medication as directed.  Please discuss all new medications with your pharmacist.  You may return to the Emergency Department at any time for worsening condition or any new symptoms that concern you.     If you develop fevers, loss of control of bowel or bladder, weakness or numbness in your legs, or are unable to walk, return to the ER for a recheck.  ° ° °Back Pain, Adult °Low back pain is very common. About 1 in 5 people have back pain. The cause of low back pain is rarely dangerous. The pain often gets better over time. About half of people with a sudden onset of back pain feel better in just 2 weeks. About 8 in 10 people feel better by 6 weeks.  °CAUSES °Some common causes of back pain include: °· Strain of the muscles or ligaments supporting the spine. °· Wear and tear (degeneration) of the spinal discs. °· Arthritis. °· Direct injury to the back. °DIAGNOSIS °Most of the time, the direct cause of low back pain is not known. However, back pain can be treated effectively even when the exact cause of the pain is unknown. Answering your caregiver's questions about your overall health and symptoms is one of the most accurate ways to make sure the cause of your pain is not dangerous. If your caregiver needs more information, he or she may order lab work or imaging tests (X-rays or MRIs). However, even if imaging tests show changes in your back, this usually does not require surgery. °HOME CARE INSTRUCTIONS °For many people, back pain returns. Since low back pain is rarely dangerous, it is often a condition that people can learn to manage on their own.  °· Remain active. It is stressful on the back to sit or stand in one place. Do not sit, drive, or stand in one place for more than 30 minutes at a time. Take short walks on level surfaces as soon as pain allows. Try to increase the length of time you walk each day. °· Do not stay in bed. Resting more than 1 or 2 days can  delay your recovery. °· Do not avoid exercise or work. Your body is made to move. It is not dangerous to be active, even though your back may hurt. Your back will likely heal faster if you return to being active before your pain is gone. °· Pay attention to your body when you  bend and lift. Many people have less discomfort when lifting if they bend their knees, keep the load close to their bodies, and avoid twisting. Often, the most comfortable positions are those that put less stress on your recovering back. °· Find a comfortable position to sleep. Use a firm mattress and lie on your side with your knees slightly bent. If you lie on your back, put a pillow under your knees. °· Only take over-the-counter or prescription medicines as directed by your caregiver. Over-the-counter medicines to reduce pain and inflammation are often the most helpful. Your caregiver may prescribe muscle relaxant drugs. These medicines help dull your pain so you can more quickly return to your normal activities and healthy exercise. °· Put ice on the injured area. °· Put ice in a plastic bag. °· Place a towel between your skin and the bag. °· Leave the ice on for 15-20 minutes, 03-04 times a day for the first 2 to 3 days. After that, ice and heat may be alternated to reduce pain and spasms. °· Ask your caregiver about trying back exercises and gentle massage. This may be   of some benefit. °· Avoid feeling anxious or stressed. Stress increases muscle tension and can worsen back pain. It is important to recognize when you are anxious or stressed and learn ways to manage it. Exercise is a great option. °SEEK MEDICAL CARE IF: °· You have pain that is not relieved with rest or medicine. °· You have pain that does not improve in 1 week. °· You have new symptoms. °· You are generally not feeling well. °SEEK IMMEDIATE MEDICAL CARE IF:  °· You have pain that radiates from your back into your legs. °· You develop new bowel or bladder control  problems. °· You have unusual weakness or numbness in your arms or legs. °· You develop nausea or vomiting. °· You develop abdominal pain. °· You feel faint. °Document Released: 04/19/2005 Document Revised: 10/19/2011 Document Reviewed: 08/21/2013 °[employer]® Patient Information ©2015 [employer]. This information is not intended to replace advice given to you by your health care provider. Make sure you discuss any questions you have with your health care provider. ° °

## 2014-08-04 LAB — URINE CULTURE
Colony Count: NO GROWTH
Culture: NO GROWTH

## 2016-02-07 IMAGING — CR DG LUMBAR SPINE COMPLETE 4+V
5 series · 5 of 5 positions shown · non-contrast
Comparison: None.

CLINICAL DATA: Low back pain and left leg pain.

EXAM:
LUMBAR SPINE - COMPLETE 4+ VIEW

[l-spine ap]
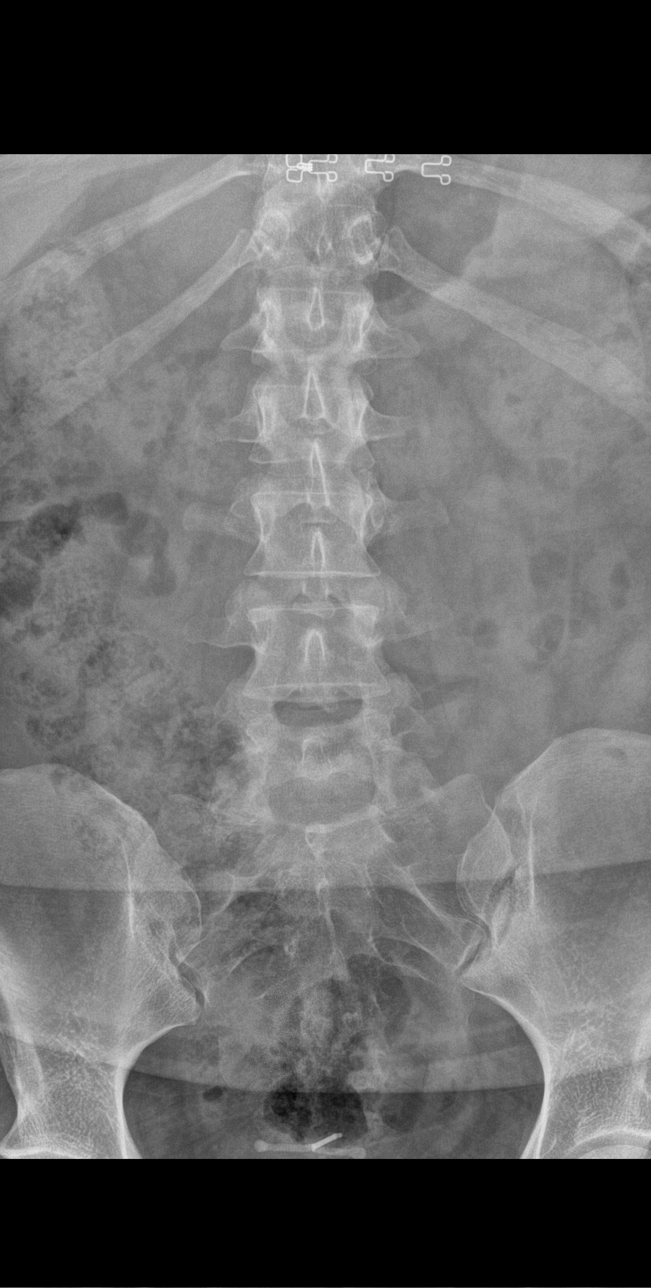

[l-spine obl (1 of 2)]
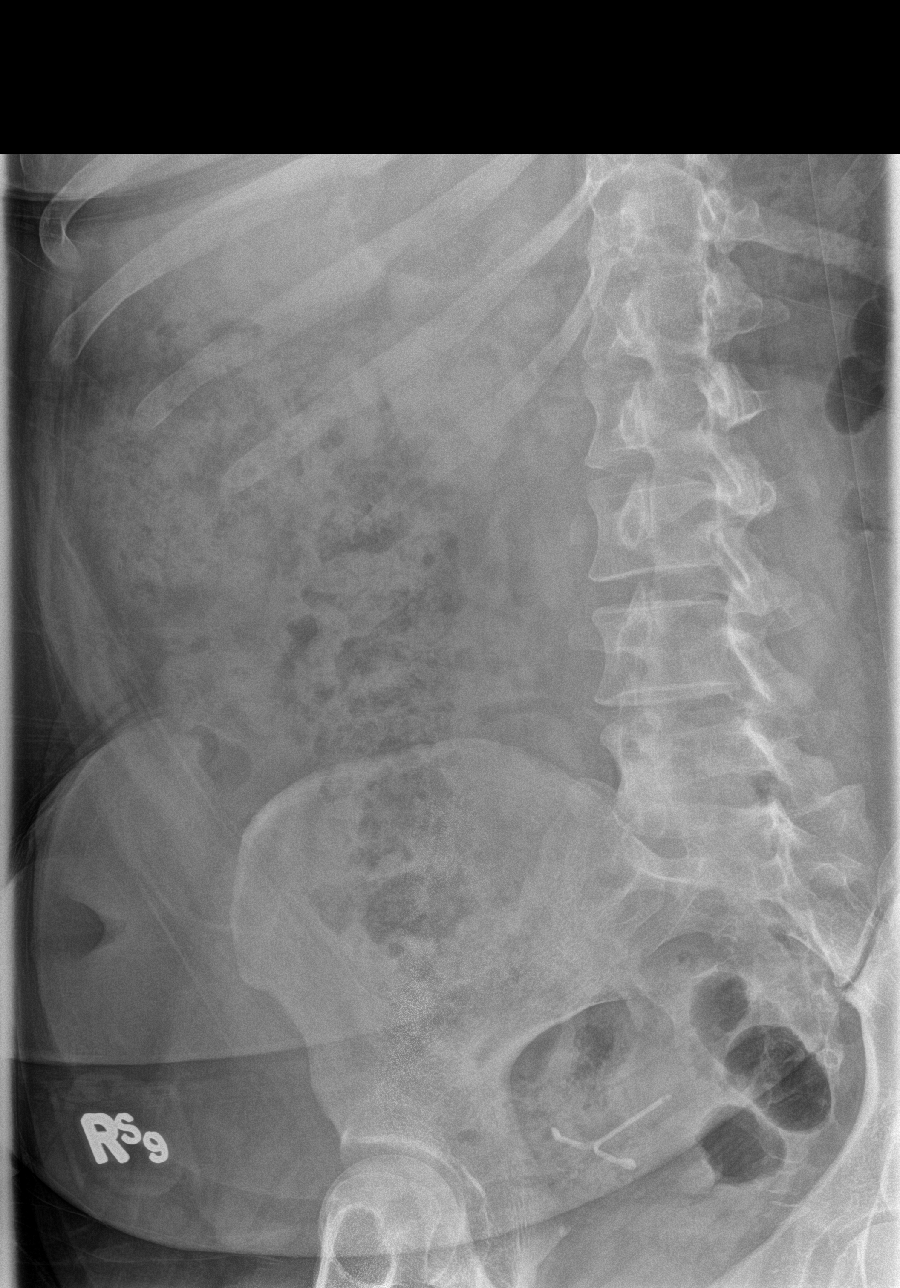

[l-spine obl (2 of 2)]
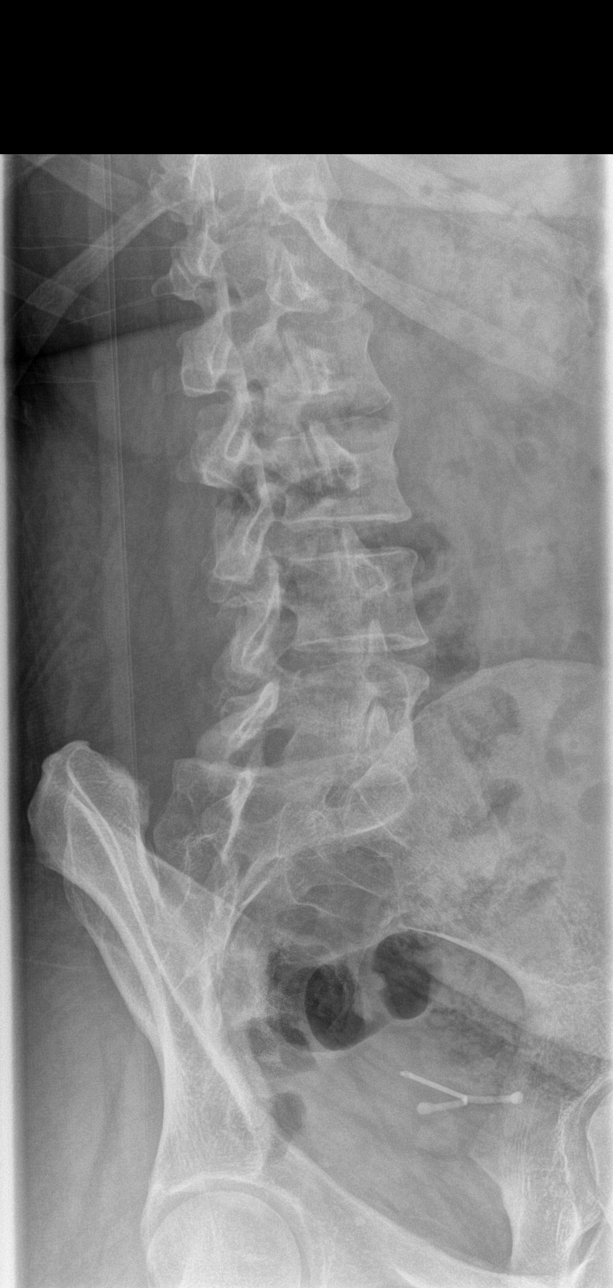

[l-spine lat]
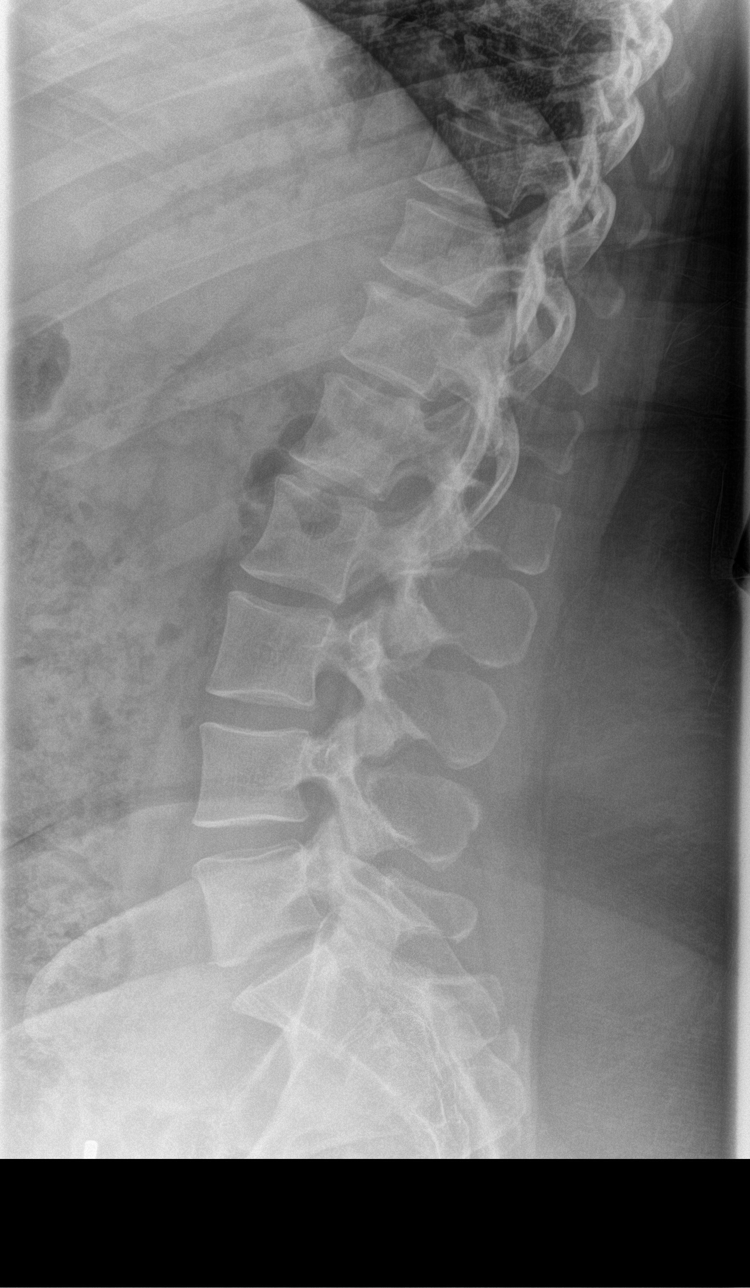

[l-spine spot]
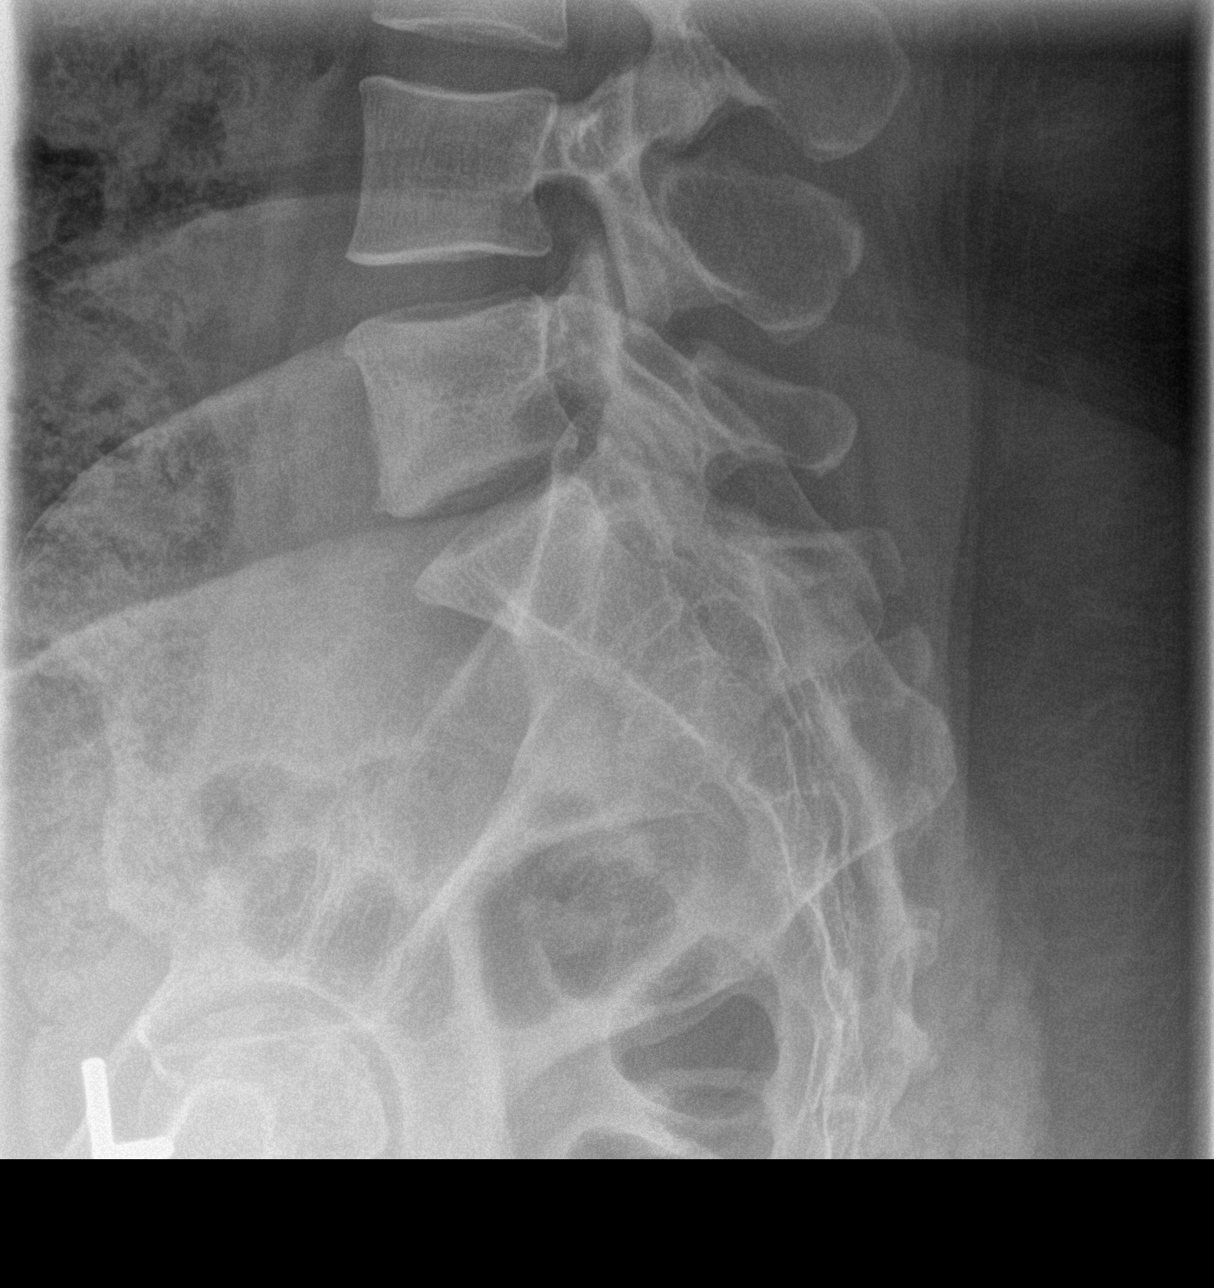

[5 of 5 positions shown; findings below may reference images not displayed]

FINDINGS: There is no evidence of lumbar spine fracture. Alignment is normal.
Intervertebral disc spaces are maintained. No facet joint disease.
IMPRESSION: Normal lumbar spine.
